# Patient Record
Sex: Female | Born: 1985 | Hispanic: Yes | Marital: Single | Smoking: Never smoker
Health system: Southern US, Community
[De-identification: ages and names within clinical notes are randomized; demographics above are authoritative.]

## PROBLEM LIST (undated history)

## (undated) DIAGNOSIS — H409 Unspecified glaucoma: Secondary | ICD-10-CM

## (undated) DIAGNOSIS — E119 Type 2 diabetes mellitus without complications: Secondary | ICD-10-CM

## (undated) DIAGNOSIS — Z789 Other specified health status: Secondary | ICD-10-CM

## (undated) HISTORY — DX: Unspecified glaucoma: H40.9

## (undated) HISTORY — PX: NO PAST SURGERIES: SHX2092

## (undated) HISTORY — DX: Type 2 diabetes mellitus without complications: E11.9

## (undated) HISTORY — DX: Other specified health status: Z78.9

---
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
## 2005-02-23 ENCOUNTER — Ambulatory Visit: Payer: Self-pay | Admitting: Internal Medicine

## 2005-04-06 ENCOUNTER — Ambulatory Visit: Payer: Self-pay | Admitting: Internal Medicine

## 2005-04-06 ENCOUNTER — Encounter (INDEPENDENT_AMBULATORY_CARE_PROVIDER_SITE_OTHER): Payer: Self-pay | Admitting: Specialist

## 2005-04-20 ENCOUNTER — Ambulatory Visit: Payer: Self-pay | Admitting: Internal Medicine

## 2005-07-13 ENCOUNTER — Ambulatory Visit: Payer: Self-pay | Admitting: *Deleted

## 2005-07-13 ENCOUNTER — Other Ambulatory Visit: Admission: RE | Admit: 2005-07-13 | Discharge: 2005-07-13 | Payer: Self-pay | Admitting: *Deleted

## 2005-07-27 ENCOUNTER — Ambulatory Visit: Payer: Self-pay | Admitting: Family Medicine

## 2006-02-01 ENCOUNTER — Encounter (INDEPENDENT_AMBULATORY_CARE_PROVIDER_SITE_OTHER): Payer: Self-pay | Admitting: *Deleted

## 2006-02-01 ENCOUNTER — Ambulatory Visit: Payer: Self-pay | Admitting: Obstetrics and Gynecology

## 2007-05-01 ENCOUNTER — Ambulatory Visit: Payer: Self-pay | Admitting: Obstetrics and Gynecology

## 2007-05-01 ENCOUNTER — Encounter: Payer: Self-pay | Admitting: Obstetrics & Gynecology

## 2007-05-09 ENCOUNTER — Ambulatory Visit (HOSPITAL_COMMUNITY): Admission: RE | Admit: 2007-05-09 | Discharge: 2007-05-09 | Payer: Self-pay | Admitting: Obstetrics & Gynecology

## 2007-05-22 ENCOUNTER — Ambulatory Visit: Payer: Self-pay | Admitting: Obstetrics & Gynecology

## 2008-04-11 ENCOUNTER — Inpatient Hospital Stay (HOSPITAL_COMMUNITY): Admission: AD | Admit: 2008-04-11 | Discharge: 2008-04-12 | Payer: Self-pay | Admitting: Obstetrics & Gynecology

## 2008-04-14 ENCOUNTER — Inpatient Hospital Stay (HOSPITAL_COMMUNITY): Admission: AD | Admit: 2008-04-14 | Discharge: 2008-04-14 | Payer: Self-pay | Admitting: Obstetrics & Gynecology

## 2008-04-17 ENCOUNTER — Inpatient Hospital Stay (HOSPITAL_COMMUNITY): Admission: RE | Admit: 2008-04-17 | Discharge: 2008-04-17 | Payer: Self-pay | Admitting: Obstetrics & Gynecology

## 2008-12-06 ENCOUNTER — Inpatient Hospital Stay (HOSPITAL_COMMUNITY): Admission: AD | Admit: 2008-12-06 | Discharge: 2008-12-08 | Payer: Self-pay | Admitting: Obstetrics

## 2009-03-28 ENCOUNTER — Emergency Department (HOSPITAL_COMMUNITY): Admission: EM | Admit: 2009-03-28 | Discharge: 2009-03-28 | Payer: Self-pay | Admitting: Emergency Medicine

## 2010-06-24 LAB — CBC
HCT: 28.3 % — ABNORMAL LOW (ref 36.0–46.0)
Hemoglobin: 9.4 g/dL — ABNORMAL LOW (ref 12.0–15.0)
MCV: 83.8 fL (ref 78.0–100.0)
Platelets: 258 10*3/uL (ref 150–400)
RDW: 15.4 % (ref 11.5–15.5)

## 2010-07-04 LAB — URINALYSIS, ROUTINE W REFLEX MICROSCOPIC
Glucose, UA: NEGATIVE mg/dL
Ketones, ur: NEGATIVE mg/dL
Nitrite: NEGATIVE
Protein, ur: NEGATIVE mg/dL
pH: 6.5 (ref 5.0–8.0)

## 2010-07-04 LAB — ABO/RH: ABO/RH(D): O POS

## 2010-07-04 LAB — WET PREP, GENITAL
Trich, Wet Prep: NONE SEEN
Yeast Wet Prep HPF POC: NONE SEEN

## 2010-07-04 LAB — CBC
MCHC: 33.1 g/dL (ref 30.0–36.0)
MCV: 87.7 fL (ref 78.0–100.0)
RBC: 4.37 MIL/uL (ref 3.87–5.11)
RDW: 14.1 % (ref 11.5–15.5)

## 2010-07-04 LAB — HCG, QUANTITATIVE, PREGNANCY
hCG, Beta Chain, Quant, S: 1235 m[IU]/mL — ABNORMAL HIGH (ref <5)
hCG, Beta Chain, Quant, S: 2686 m[IU]/mL — ABNORMAL HIGH (ref <5)

## 2010-08-02 NOTE — H&P (Signed)
NAME:  Allison Monroe, THINNES NO.:  [id number]   MEDICAL RECORD NO.:  [id number]          PATIENT TYPE:  WOC   LOCATION:  WOC                          FACILITY:  [facility]   PHYSICIAN:  Ruthe Mannan, M.D.       DATE OF BIRTH:  [birth year]   DATE OF ADMISSION:  05/22/2007  DATE OF DISCHARGE:                              HISTORY & PHYSICAL   CHIEF COMPLAINT:  The patient presents for follow-up of ultrasound.   HISTORY OF PRESENT ILLNESS:  The patient was here on May 01, 2007,  for Pap smear and was also complaining of pelvic pain at that time.  She  was found to have some suprapubic pain.  At that time she had a negative  UA, a negative urine pregnancy test.  Pap came back normal.  GC and  chlamydia was also negative.  Wet prep did show yeast and she did not  yet receive her prescription for Diflucan.  She reports today and says  that her pain is better, however, she was told to come follow up for  evaluation of her ultrasound results.   PHYSICAL EXAMINATION:  VITAL SIGNS:  Temperature 97.8, pulse 68,  respiratory rate 16, blood pressure 109/67, weight 151.3.  GENERAL:  She is alert and oriented in no acute distress.  Pleasant.  ABDOMEN:  Nontender to palpation throughout.  No rebound and no  guarding.   ASSESSMENT:  This is a 25 year old gravida 0, who presents for a follow-  up of pelvic ultrasound performed at her annual examination in February.  Pap was normal and Wet prep only showed yeast.  Prescription for  Diflucan 150 mg p.o. x1 given.  She states that she is interested in  getting HIV test.  We will go ahead and check HIV today.      Ruthe Mannan, M.D.  Electronically Signed     TA/MEDQ  D:  05/22/2007  T:  05/22/2007  Job:  [id number]

## 2010-09-23 ENCOUNTER — Emergency Department (HOSPITAL_COMMUNITY): Payer: Self-pay

## 2010-09-23 ENCOUNTER — Emergency Department (HOSPITAL_COMMUNITY)
Admission: EM | Admit: 2010-09-23 | Discharge: 2010-09-24 | Discharge status: Against Medical Advice | Payer: Self-pay | Attending: Emergency Medicine | Admitting: Emergency Medicine

## 2010-09-23 DIAGNOSIS — R05 Cough: Secondary | ICD-10-CM | POA: Insufficient documentation

## 2010-09-23 DIAGNOSIS — R059 Cough, unspecified: Secondary | ICD-10-CM | POA: Insufficient documentation

## 2010-09-24 ENCOUNTER — Emergency Department (HOSPITAL_COMMUNITY): Payer: Self-pay

## 2010-09-24 ENCOUNTER — Emergency Department (HOSPITAL_COMMUNITY)
Admission: EM | Admit: 2010-09-24 | Discharge: 2010-09-24 | Disposition: A | Discharge status: Home / Self Care | Payer: Self-pay | Attending: Emergency Medicine | Admitting: Emergency Medicine

## 2010-09-24 DIAGNOSIS — IMO0001 Reserved for inherently not codable concepts without codable children: Secondary | ICD-10-CM | POA: Insufficient documentation

## 2010-09-24 DIAGNOSIS — R059 Cough, unspecified: Secondary | ICD-10-CM | POA: Insufficient documentation

## 2010-09-24 DIAGNOSIS — R05 Cough: Secondary | ICD-10-CM | POA: Insufficient documentation

## 2010-09-24 DIAGNOSIS — J069 Acute upper respiratory infection, unspecified: Secondary | ICD-10-CM | POA: Insufficient documentation

## 2010-12-09 LAB — POCT URINALYSIS DIP (DEVICE)
Nitrite: NEGATIVE
Urobilinogen, UA: 0.2
pH: 7

## 2012-12-16 ENCOUNTER — Encounter (HOSPITAL_COMMUNITY): Payer: Self-pay | Admitting: Emergency Medicine

## 2012-12-16 ENCOUNTER — Emergency Department (HOSPITAL_COMMUNITY)
Admission: EM | Admit: 2012-12-16 | Discharge: 2012-12-16 | Disposition: A | Discharge status: Home / Self Care | Payer: Self-pay | Source: Home / Self Care | Attending: Emergency Medicine | Admitting: Emergency Medicine

## 2012-12-16 DIAGNOSIS — H6091 Unspecified otitis externa, right ear: Secondary | ICD-10-CM

## 2012-12-16 DIAGNOSIS — H6691 Otitis media, unspecified, right ear: Secondary | ICD-10-CM

## 2012-12-16 DIAGNOSIS — T161XXA Foreign body in right ear, initial encounter: Secondary | ICD-10-CM

## 2012-12-16 MED ORDER — NEOMYCIN-POLYMYXIN-HC 3.5-10000-1 OT SUSP: 4.0000 [drp] | Freq: Three times a day (TID) | OTIC | Status: DC

## 2012-12-16 MED ORDER — AMOXICILLIN 500 MG PO CAPS: 1000.0000 mg | ORAL_CAPSULE | Freq: Three times a day (TID) | ORAL | Status: DC

## 2012-12-16 NOTE — ED Provider Notes (Signed)
Chief Complaint:   Chief Complaint  Patient presents with  . Otalgia    History of Present Illness:   Allison Monroe is a 27 year old female who has had a three-week history of pain in her right ear, drainage, and a sensation of congestion. She denies any fever, headache, nasal congestion, rhinorrhea, sore throat, adenopathy, or cough.  Review of Systems:  Other than noted above, the patient denies any of the following symptoms: Systemic:  No fevers, chills, sweats, weight loss or gain, fatigue, or tiredness. Eye:  No redness, pain, discharge, itching, blurred vision, or diplopia. ENT:  No headache, nasal congestion, sneezing, itching, epistaxis, ear pain, congestion, decreased hearing, ringing in ears, vertigo, or tinnitus.  No oral lesions, sore throat, pain on swallowing, or hoarseness. Neck:  No mass, tenderness or adenopathy. Lungs:  No coughing, wheezing, or shortness of breath. Skin:  No rash or itching.  PMFSH:  Past medical history, family history, social history, meds, and allergies were reviewed.   Physical Exam:   Vital signs:  BP 115/58  Pulse 68  Temp(Src) 98.2 F (36.8 C) (Oral)  Resp 16  SpO2 100% General:  Alert and oriented.  In no distress.  Skin warm and dry. Eye:  PERRL, full EOMs, lids and conjunctiva normal.   ENT:  There was what appears to be a foreign body in the right ear canal, the TM was erythematous, the canal was also erythematous. Left TM and canal were normal. Nasal mucosa not congested and without drainage.  Mucous membranes moist, no oral lesions, normal dentition, pharynx clear.  No cranial or facial pain to palplation. Neck:  Supple, full ROM.  No adenopathy, tenderness or mass.  Thyroid normal. Lungs:  Breath sounds clear and equal bilaterally.  No wheezes, rales or rhonchi. Heart:  Rhythm regular, without extrasystoles.  No gallops or murmers. Skin:  Clear, warm and dry.   Course in Urgent Care Center:   The foreign body was irrigated out  and appears to be the cotton tip of a Q-tip.  Assessment:  The primary encounter diagnosis was Acute foreign body of ear canal, right, initial encounter. Diagnoses of Otitis externa, right and Otitis media, right were also pertinent to this visit.  Plan:   1.  Meds:  The following meds were prescribed:   New Prescriptions   AMOXICILLIN (AMOXIL) 500 MG CAPSULE    Take 2 capsules (1,000 mg total) by mouth 3 (three) times daily.   NEOMYCIN-POLYMYXIN-HYDROCORTISONE (CORTISPORIN) 3.5-10000-1 OTIC SUSPENSION    Place 4 drops into the right ear 3 (three) times daily.    2.  Patient Education/Counseling:  The patient was given appropriate handouts, self care instructions, and instructed in symptomatic relief.  Advised to avoid Q-tips in the future and to keep water out of the ear for the next week.  3.  Follow up:  The patient was told to follow up if no better in 3 to 4 days, if becoming worse in any way, and given some red flag symptoms such as worsening pain or difficulty hearing which would prompt immediate return.  Follow up here if necessary.     Reuben Likes, MD 12/16/12 [phone]

## 2012-12-16 NOTE — ED Notes (Signed)
C/o right ear pain.  Patient states she has used OTC debrox but no relief.

## 2013-11-28 ENCOUNTER — Other Ambulatory Visit: Payer: Self-pay | Admitting: Physician Assistant

## 2013-11-28 DIAGNOSIS — R234 Changes in skin texture: Secondary | ICD-10-CM

## 2013-11-28 DIAGNOSIS — N649 Disorder of breast, unspecified: Secondary | ICD-10-CM

## 2013-12-03 ENCOUNTER — Ambulatory Visit
Admission: RE | Admit: 2013-12-03 | Discharge: 2013-12-03 | Disposition: A | Discharge status: Home / Self Care | Payer: No Typology Code available for payment source | Source: Ambulatory Visit | Attending: Physician Assistant | Admitting: Physician Assistant

## 2013-12-03 ENCOUNTER — Encounter (INDEPENDENT_AMBULATORY_CARE_PROVIDER_SITE_OTHER): Payer: Self-pay

## 2013-12-03 DIAGNOSIS — N649 Disorder of breast, unspecified: Secondary | ICD-10-CM

## 2013-12-03 DIAGNOSIS — R234 Changes in skin texture: Secondary | ICD-10-CM

## 2015-11-07 ENCOUNTER — Encounter (HOSPITAL_COMMUNITY): Payer: Self-pay

## 2015-11-07 ENCOUNTER — Emergency Department (HOSPITAL_COMMUNITY)
Admission: EM | Admit: 2015-11-07 | Discharge: 2015-11-07 | Disposition: A | Discharge status: Home / Self Care | Payer: Self-pay | Attending: Emergency Medicine | Admitting: Emergency Medicine

## 2015-11-07 DIAGNOSIS — M5431 Sciatica, right side: Secondary | ICD-10-CM | POA: Insufficient documentation

## 2015-11-07 DIAGNOSIS — B3741 Candidal cystitis and urethritis: Secondary | ICD-10-CM

## 2015-11-07 DIAGNOSIS — N3090 Cystitis, unspecified without hematuria: Secondary | ICD-10-CM | POA: Insufficient documentation

## 2015-11-07 LAB — URINALYSIS, ROUTINE W REFLEX MICROSCOPIC
BILIRUBIN URINE: NEGATIVE
GLUCOSE, UA: NEGATIVE mg/dL
HGB URINE DIPSTICK: NEGATIVE
KETONES UR: NEGATIVE mg/dL
Nitrite: NEGATIVE
PROTEIN: NEGATIVE mg/dL
Specific Gravity, Urine: 1.015 (ref 1.005–1.030)
pH: 6 (ref 5.0–8.0)

## 2015-11-07 LAB — URINE MICROSCOPIC-ADD ON

## 2015-11-07 MED ORDER — PREDNISONE 20 MG PO TABS: 40.0000 mg | ORAL_TABLET | Freq: Every day | ORAL | 0 refills | Status: DC

## 2015-11-07 MED ORDER — TRAMADOL HCL 50 MG PO TABS: 50.0000 mg | ORAL_TABLET | Freq: Four times a day (QID) | ORAL | 0 refills | Status: DC | PRN

## 2015-11-07 MED ORDER — FLUCONAZOLE 100 MG PO TABS
150.0000 mg | ORAL_TABLET | Freq: Once | ORAL | Status: AC
  Administered 2015-11-07: 150 mg via ORAL
  Filled 2015-11-07: qty 2

## 2015-11-07 MED ORDER — KETOROLAC TROMETHAMINE 60 MG/2ML IM SOLN
60.0000 mg | Freq: Once | INTRAMUSCULAR | Status: AC
  Administered 2015-11-07: 60 mg via INTRAMUSCULAR
  Filled 2015-11-07: qty 2

## 2015-11-07 MED ORDER — CYCLOBENZAPRINE HCL 10 MG PO TABS
10.0000 mg | ORAL_TABLET | Freq: Once | ORAL | Status: AC
  Administered 2015-11-07: 10 mg via ORAL
  Filled 2015-11-07: qty 1

## 2015-11-07 MED ORDER — DEXAMETHASONE SODIUM PHOSPHATE 10 MG/ML IJ SOLN
10.0000 mg | Freq: Once | INTRAMUSCULAR | Status: AC
  Administered 2015-11-07: 10 mg via INTRAMUSCULAR
  Filled 2015-11-07: qty 1

## 2015-11-07 MED ORDER — TRAMADOL HCL 50 MG PO TABS
50.0000 mg | ORAL_TABLET | Freq: Once | ORAL | Status: AC
  Administered 2015-11-07: 50 mg via ORAL
  Filled 2015-11-07: qty 1

## 2015-11-07 MED ORDER — CYCLOBENZAPRINE HCL 10 MG PO TABS
10.0000 mg | ORAL_TABLET | Freq: Two times a day (BID) | ORAL | 0 refills | Status: DC | PRN
Start: 2015-11-07 — End: 2016-03-08

## 2015-11-07 NOTE — ED Triage Notes (Signed)
Patient here with ongoing right lower back pain x 1 month. Seen on 8/8 with negative urine and negative pelvic, no relief with mobic

## 2015-11-07 NOTE — ED Provider Notes (Signed)
[facility] DEPT Provider Note   CSN: [id number] Arrival date & time: 11/07/15  [id number]  By signing my name below, I, Royce Macadamia, attest that this documentation has been prepared under the direction and in the presence of  Delsa Grana, PA-C. Electronically Signed: Royce Macadamia, ED Scribe. 11/07/15. 11:23 AM.   History   Chief Complaint Chief Complaint  Patient presents with  . Back Pain   The history is provided by the patient. No language interpreter was used.    HPI Comments:  Allison Monroe is a 30 y.o. female who presents to the Emergency Department complaining of persistent lower back pain beginning a month ago.  She describes the pain as 6-7/10 and states that it radiates to her right buttock, right flank and right upper abdomen.  Her pain is worse with movement and ablulation and she notes a pressure when sitting.  She states that she woke up one day with mild pain and has gradually worsened over the past month. She was evaluated 12 days ago for the same complaint at [facility] and she was given Mobic but she states she has had no relief.  Urine testing was negative at that time.  However she does complain of recent urinary frequency with small volume of urine but denies hematuria, suprapubic pain, abdominal pain, nausea, vomiting, fever.  She denies any vaginal complaints and her last measure. She cannot remember because she has been getting depo injections.   History reviewed. No pertinent past medical history.  There are no active problems to display for this patient.   History reviewed. No pertinent surgical history.  OB History    No data available       Home Medications    Prior to Admission medications   Medication Sig Start Date End Date Taking? Authorizing Provider  amoxicillin (AMOXIL) 500 MG capsule Take 2 capsules (1,000 mg total) by mouth 3 (three) times daily. 12/16/12   Harden Mo, MD  cyclobenzaprine (FLEXERIL) 10 MG tablet Take 1 tablet  (10 mg total) by mouth 2 (two) times daily as needed for muscle spasms. 11/07/15   Delsa Grana, PA-C  neomycin-polymyxin-hydrocortisone (CORTISPORIN) 3.5-10000-1 otic suspension Place 4 drops into the right ear 3 (three) times daily. 12/16/12   Harden Mo, MD  predniSONE (DELTASONE) 20 MG tablet Take 2 tablets (40 mg total) by mouth daily. 11/07/15   Delsa Grana, PA-C  traMADol (ULTRAM) 50 MG tablet Take 1 tablet (50 mg total) by mouth every 6 (six) hours as needed. 11/07/15   Delsa Grana, PA-C    Family History No family history on file.  Social History Social History  Substance Use Topics  . Smoking status: Never Smoker  . Smokeless tobacco: Never Used  . Alcohol use Not on file     Allergies   Review of patient's allergies indicates no known allergies.   Review of Systems Review of Systems  All other systems reviewed and are negative.    Physical Exam Updated Vital Signs BP 119/87 (BP Location: Left Arm)   Pulse 60   Temp 97.7 F (36.5 C) (Oral)   Resp 18   SpO2 100%   Physical Exam  Constitutional: She is oriented to person, place, and time. She appears well-developed and well-nourished. No distress.  HENT:  Head: Normocephalic and atraumatic.  Nose: Nose normal.  Mouth/Throat: Oropharynx is clear and moist. No oropharyngeal exudate.  Eyes: Conjunctivae and EOM are normal. Pupils are equal, round, and reactive to light. Right eye exhibits no  discharge. Left eye exhibits no discharge. No scleral icterus.  Neck: Normal range of motion. No JVD present. No tracheal deviation present. No thyromegaly present.  Cardiovascular: Normal rate, regular rhythm, normal heart sounds and intact distal pulses.  Exam reveals no gallop and no friction rub.   No murmur heard. Pulmonary/Chest: Effort normal and breath sounds normal. No respiratory distress. She has no wheezes. She has no rales. She exhibits no tenderness.  Abdominal: Soft. Bowel sounds are normal. She exhibits no  distension and no mass. There is no tenderness. There is no rebound and no guarding.  No CVA tenderness  Musculoskeletal: Normal range of motion. She exhibits tenderness. She exhibits no edema or deformity.  Lower lumbar tendernes to palpation.  No spinal step offs.  Right lumbar para-spinal tendermess and tenderness over her SI joint and into her right buttocks.     Lymphadenopathy:    She has no cervical adenopathy.  Neurological: She is alert and oriented to person, place, and time. She exhibits normal muscle tone. Coordination normal.  Normal gait  Skin: Skin is warm and dry. Capillary refill takes less than 2 seconds. No rash noted. She is not diaphoretic. No erythema. No pallor.  Psychiatric: She has a normal mood and affect. Her behavior is normal. Judgment and thought content normal.  Nursing note and vitals reviewed.    ED Treatments / Results   DIAGNOSTIC STUDIES:  Oxygen Saturation is 99% on RA, nml by my interpretation.    COORDINATION OF CARE:  11:23 AM Discussed treatment plan with pt at bedside and pt agreed to plan.  Labs (all labs ordered are listed, but only abnormal results are displayed) Labs Reviewed  URINALYSIS, ROUTINE W REFLEX MICROSCOPIC (NOT AT [facility]) - Abnormal; Notable for the following:       Result Value   Leukocytes, UA TRACE (*)    All other components within normal limits  URINE MICROSCOPIC-ADD ON - Abnormal; Notable for the following:    Squamous Epithelial / LPF 0-5 (*)    Bacteria, UA RARE (*)    All other components within normal limits    EKG  EKG Interpretation None       Radiology No results found.  Procedures Procedures (including critical care time)  Medications Ordered in ED Medications  ketorolac (TORADOL) injection 60 mg (60 mg Intramuscular Given 11/07/15 1215)  dexamethasone (DECADRON) injection 10 mg (10 mg Intramuscular Given 11/07/15 1215)  cyclobenzaprine (FLEXERIL) tablet 10 mg (10 mg Oral Given 11/07/15 1216)    traMADol (ULTRAM) tablet 50 mg (50 mg Oral Given 11/07/15 1216)  fluconazole (DIFLUCAN) tablet 150 mg (150 mg Oral Given 11/07/15 1216)     Initial Impression / Assessment and Plan / ED Course  I have reviewed the triage vital signs and the nursing notes.  Pertinent labs & imaging results that were available during my care of the patient were reviewed by me and considered in my medical decision making (see chart for details).  Clinical Course    Patient with 1 month of lower right back pain, clinically consistent with sciatica. Worked up for urinary complaints including frequency, no abdominal complaints, no constitutional symptoms. Patient is well-appearing with stable vital signs, afebrile.   No neurological deficits and normal neuro exam.     No loss of bowel or bladder control.  No concern for cauda equina.  No fever, night sweats, weight loss, h/o cancer, IVDU.  Urinalysis was negative for UTI but pertinent for presence of yeast, treated with Diflucan.  Back pain treated with muscle relaxers, steroids, pain medicine.  She was given Toradol and Decadron in the ER. Discharged in good condition.  Final Clinical Impressions(s) / ED Diagnoses   Final diagnoses:  Sciatica of right side  Yeast cystitis    New Prescriptions Discharge Medication List as of 11/07/2015 12:09 PM    START taking these medications   Details  cyclobenzaprine (FLEXERIL) 10 MG tablet Take 1 tablet (10 mg total) by mouth 2 (two) times daily as needed for muscle spasms., Starting Sun 11/07/2015, Print    predniSONE (DELTASONE) 20 MG tablet Take 2 tablets (40 mg total) by mouth daily., Starting Sun 11/07/2015, Print    traMADol (ULTRAM) 50 MG tablet Take 1 tablet (50 mg total) by mouth every 6 (six) hours as needed., Starting Sun 11/07/2015, Print       I personally performed the services described in this documentation, which was scribed in my presence. The recorded information has been reviewed and is accurate.        Delsa Grana, PA-C 11/07/15 1354    Gareth Morgan, MD 11/11/15 [phone]

## 2015-11-07 NOTE — ED Notes (Signed)
Declined W/C at D/C and was escorted to lobby by RN. 

## 2016-03-08 ENCOUNTER — Ambulatory Visit (HOSPITAL_COMMUNITY)
Admission: EM | Admit: 2016-03-08 | Discharge: 2016-03-08 | Disposition: A | Discharge status: Home / Self Care | Payer: Self-pay | Attending: Emergency Medicine | Admitting: Emergency Medicine

## 2016-03-08 ENCOUNTER — Encounter (HOSPITAL_COMMUNITY): Payer: Self-pay | Admitting: Emergency Medicine

## 2016-03-08 DIAGNOSIS — K121 Other forms of stomatitis: Secondary | ICD-10-CM

## 2016-03-08 MED ORDER — AMOXICILLIN 500 MG PO CAPS: 500.0000 mg | ORAL_CAPSULE | Freq: Three times a day (TID) | ORAL | 0 refills | Status: DC

## 2016-03-08 NOTE — ED Triage Notes (Addendum)
Pt reports a sore in her left upper mouth that has been there for about 10 days.  Pt just started using Paroxyl last night.  Pt denies any fever.

## 2016-03-08 NOTE — ED Provider Notes (Signed)
[place]    CSN: [id number] Arrival date & time: 03/08/16  1536     History   Chief Complaint Chief Complaint  Patient presents with  . Mouth Lesions    HPI Allison Monroe is a 30 y.o. female.   HPI She is a 30 year old woman here for evaluation of mouth lesion. She states it has been present about 10 days. Initially, it was a small bump, but has gradually been getting bigger in size. It is quite painful. She has not noticed any drainage. No fevers. She does have a little bit of discomfort with swallowing. She just started a hydrogen peroxide rinse last night.  History reviewed. No pertinent past medical history.  There are no active problems to display for this patient.   History reviewed. No pertinent surgical history.  OB History    No data available       Home Medications    Prior to Admission medications   Medication Sig Start Date End Date Taking? Authorizing Provider  amoxicillin (AMOXIL) 500 MG capsule Take 1 capsule (500 mg total) by mouth 3 (three) times daily. 03/08/16   Melony Overly, MD    Family History History reviewed. No pertinent family history.  Social History Social History  Substance Use Topics  . Smoking status: Never Smoker  . Smokeless tobacco: Never Used  . Alcohol use Not on file     Allergies   Patient has no known allergies.   Review of Systems Review of Systems As in history of present illness  Physical Exam Triage Vital Signs ED Triage Vitals [03/08/16 1553]  Enc Vitals Group     BP (!) 114/52     Pulse Rate 67     Resp      Temp 98.3 F (36.8 C)     Temp Source Oral     SpO2 100 %     Weight      Height      Head Circumference      Peak Flow      Pain Score 7     Pain Loc      Pain Edu?      Excl. in [place]?    No data found.   Updated Vital Signs BP (!) 114/52 (BP Location: Left Arm)   Pulse 67   Temp 98.3 F (36.8 C) (Oral)   SpO2 100%   Visual Acuity Right Eye Distance:     Left Eye Distance:   Bilateral Distance:    Right Eye Near:   Left Eye Near:    Bilateral Near:     Physical Exam  Constitutional: She is oriented to person, place, and time. She appears well-developed and well-nourished. No distress.  HENT:  Mouth/Throat:    Cardiovascular: Normal rate.   Pulmonary/Chest: Effort normal.  Neurological: She is alert and oriented to person, place, and time.     UC Treatments / Results  Labs (all labs ordered are listed, but only abnormal results are displayed) Labs Reviewed - No data to display  EKG  EKG Interpretation None       Radiology No results found.  Procedures Procedures (including critical care time)  Medications Ordered in UC Medications - No data to display   Initial Impression / Assessment and Plan / UC Course  I have reviewed the triage vital signs and the nursing notes.  Pertinent labs & imaging results that were available during my care of the patient were reviewed by me and  considered in my medical decision making (see chart for details).  Clinical Course     We'll cover with antibiotics with amoxicillin. OTC Xilactin to help control pain. Follow-up if not improving in 1 week.  Final Clinical Impressions(s) / UC Diagnoses   Final diagnoses:  Mouth ulcer    New Prescriptions New Prescriptions   AMOXICILLIN (AMOXIL) 500 MG CAPSULE    Take 1 capsule (500 mg total) by mouth 3 (three) times daily.     Melony Overly, MD 03/08/16 [phone]

## 2016-03-08 NOTE — Discharge Instructions (Signed)
You have a small ulcer in the mouth. Take amoxicillin 3 times a day for 1 week. Use Xilactin as directed. If this has not healed in 1 week, please come back.

## 2018-04-10 ENCOUNTER — Other Ambulatory Visit: Payer: Self-pay

## 2018-04-10 ENCOUNTER — Encounter (HOSPITAL_COMMUNITY): Payer: Self-pay | Admitting: Emergency Medicine

## 2018-04-10 ENCOUNTER — Ambulatory Visit (HOSPITAL_COMMUNITY)
Admission: EM | Admit: 2018-04-10 | Discharge: 2018-04-10 | Disposition: A | Discharge status: Home / Self Care | Payer: Self-pay | Attending: Family Medicine | Admitting: Family Medicine

## 2018-04-10 DIAGNOSIS — K2100 Gastro-esophageal reflux disease with esophagitis, without bleeding: Secondary | ICD-10-CM

## 2018-04-10 MED ORDER — OMEPRAZOLE 20 MG PO CPDR: 20.0000 mg | DELAYED_RELEASE_CAPSULE | Freq: Every day | ORAL | 1 refills | Status: DC

## 2018-04-10 NOTE — ED Triage Notes (Signed)
Pt presents to [facility] for assessment of chest pain since 08-28-17.  States her mother died and she would have intermittent episodes of central chest pain.  In the past two weeks the pain has become more constant and stronger, no radiating to both shoulders.  Denies any other associated symptoms.  States she has had a lot of depression, anxiety and fatigue since 08-29-2022.

## 2018-04-10 NOTE — ED Provider Notes (Signed)
[place]    CSN: [id number] Arrival date & time: 04/10/18  1556     History   Chief Complaint Chief Complaint  Patient presents with  . Chest Pain    HPI Allison Monroe is a 33 y.o. female.   Patient has some nonspecific chest pain.  Also admits to reflux.  She has no risk factors for heart disease.  Pain is not related to exertion.  HPI  History reviewed. No pertinent past medical history.  There are no active problems to display for this patient.   History reviewed. No pertinent surgical history.  OB History   No obstetric history on file.      Home Medications    Prior to Admission medications   Medication Sig Start Date End Date Taking? Authorizing Provider  amoxicillin (AMOXIL) 500 MG capsule Take 1 capsule (500 mg total) by mouth 3 (three) times daily. 03/08/16   Melony Overly, MD  omeprazole (PRILOSEC) 20 MG capsule Take 1 capsule (20 mg total) by mouth daily. 04/10/18   Wardell Honour, MD    Family History History reviewed. No pertinent family history.  Social History Social History   Tobacco Use  . Smoking status: Never Smoker  . Smokeless tobacco: Never Used  Substance Use Topics  . Alcohol use: Not on file  . Drug use: Not on file     Allergies   Patient has no known allergies.   Review of Systems Review of Systems  Constitutional: Negative.   Cardiovascular: Positive for chest pain.  All other systems reviewed and are negative.    Physical Exam Triage Vital Signs ED Triage Vitals  Enc Vitals Group     BP 04/10/18 1642 (!) 143/89     Pulse Rate 04/10/18 1642 60     Resp 04/10/18 1642 18     Temp 04/10/18 1642 98.6 F (37 C)     Temp Source 04/10/18 1642 Oral     SpO2 04/10/18 1642 100 %     Weight --      Height --      Head Circumference --      Peak Flow --      Pain Score 04/10/18 1643 7     Pain Loc --      Pain Edu? --      Excl. in [place]? --    No data found.  Updated Vital Signs BP (!)  143/89 (BP Location: Left Arm)   Pulse 60   Temp 98.6 F (37 C) (Oral)   Resp 18   SpO2 100%   Visual Acuity Right Eye Distance:   Left Eye Distance:   Bilateral Distance:    Right Eye Near:   Left Eye Near:    Bilateral Near:     Physical Exam Constitutional:      Appearance: She is well-developed.  Cardiovascular:     Heart sounds: Normal heart sounds. No murmur. No systolic murmur. No diastolic murmur.  Pulmonary:     Effort: Pulmonary effort is normal.     Breath sounds: Normal breath sounds.  Neurological:     General: No focal deficit present.     Mental Status: She is alert and oriented to person, place, and time.    EKG has some nonspecific findings but no acute ischemic changes  UC Treatments / Results  Labs (all labs ordered are listed, but only abnormal results are displayed) Labs Reviewed - No data to display  EKG None  Radiology No results found.  Procedures Procedures (including critical care time)  Medications Ordered in UC Medications - No data to display  Initial Impression / Assessment and Plan / UC Course  I have reviewed the triage vital signs and the nursing notes.  Pertinent labs & imaging results that were available during my care of the patient were reviewed by me and considered in my medical decision making (see chart for details).     Chest pain, probably related to esophageal reflux and spasm Final Clinical Impressions(s) / UC Diagnoses   Final diagnoses:  Gastroesophageal reflux disease with esophagitis   Discharge Instructions   None    ED Prescriptions    Medication Sig Dispense Auth. Provider   omeprazole (PRILOSEC) 20 MG capsule Take 1 capsule (20 mg total) by mouth daily. 15 capsule Wardell Honour, MD     Controlled Substance Prescriptions [place] Controlled Substance Registry consulted? No   Wardell Honour, MD 04/10/18 1740

## 2018-04-22 ENCOUNTER — Encounter (HOSPITAL_COMMUNITY): Payer: Self-pay | Admitting: *Deleted

## 2018-04-22 ENCOUNTER — Emergency Department (HOSPITAL_COMMUNITY): Payer: Self-pay

## 2018-04-22 ENCOUNTER — Emergency Department (HOSPITAL_COMMUNITY)
Admission: EM | Admit: 2018-04-22 | Discharge: 2018-04-22 | Disposition: A | Discharge status: Home / Self Care | Payer: Self-pay | Attending: Emergency Medicine | Admitting: Emergency Medicine

## 2018-04-22 DIAGNOSIS — R0789 Other chest pain: Secondary | ICD-10-CM | POA: Insufficient documentation

## 2018-04-22 DIAGNOSIS — R079 Chest pain, unspecified: Secondary | ICD-10-CM

## 2018-04-22 LAB — CBC
HCT: 42.6 % (ref 36.0–46.0)
HEMOGLOBIN: 13.9 g/dL (ref 12.0–15.0)
MCH: 28.8 pg (ref 26.0–34.0)
MCHC: 32.6 g/dL (ref 30.0–36.0)
MCV: 88.2 fL (ref 80.0–100.0)
Platelets: 331 10*3/uL (ref 150–400)
RBC: 4.83 MIL/uL (ref 3.87–5.11)
RDW: 13.5 % (ref 11.5–15.5)
WBC: 11.2 10*3/uL — AB (ref 4.0–10.5)
nRBC: 0 % (ref 0.0–0.2)

## 2018-04-22 LAB — I-STAT TROPONIN, ED: Troponin i, poc: 0 ng/mL (ref 0.00–0.08)

## 2018-04-22 LAB — I-STAT CREATININE, ED: CREATININE: 0.8 mg/dL (ref 0.44–1.00)

## 2018-04-22 MED ORDER — SODIUM CHLORIDE 0.9% FLUSH: 3.0000 mL | Freq: Once | INTRAVENOUS | Status: DC

## 2018-04-22 NOTE — ED Triage Notes (Signed)
Pt in c/o continued chest pain, has been going on for several weeks, went to urgent care for same, pain worse with inspiration, also reports cough but its mild

## 2018-04-22 NOTE — Discharge Instructions (Addendum)
Please read attached information. If you experience any new or worsening signs or symptoms please return to the emergency room for evaluation. Please follow-up with your primary care provider or specialist as discussed.  °

## 2018-04-22 NOTE — ED Notes (Signed)
Patient verbalizes understanding of discharge instructions. Opportunity for questioning and answers were provided. Armband removed by staff, pt discharged from ED ambulatory.   

## 2018-04-22 NOTE — ED Provider Notes (Signed)
[facility]PART[facility] Provider Note   CSN: [id number] Arrival date & time: 04/22/18  1151   History   Chief Complaint Chief Complaint  Patient presents with  . Chest Pain    HPI Allison Monroe is a 33 y.o. female.  HPI   33 year old female presents today with complaints of chest pain.  Patient notes a 3-week history of persistent chest pain.  She notes this is a "poking" sensation on the left side of her chest, but now has radiated to the right.  She notes that when she takes a deep breath she feels pressure on her chest.  She denies any significant shortness of breath.  She notes a burning sensation as well.  She denies any abdominal pain, fever, or productive cough.  Patient denies history DVT or PE, denies any lower extremity swelling or edema, she notes she is Depakote for birth control, she is not pregnant, non-smoker, no personal cardiac history.  She reports that her mother had high blood pressure and her grandma had a pacemaker but no other known cardiac disease.  She was seen at urgent care where she had an EKG, she was told it was likely acid reflux was charged home on medication for reflux which did not improve her symptoms.  Patient notes she has been under significant stress as she recently lost her mother.   History reviewed. No pertinent past medical history.  There are no active problems to display for this patient.   History reviewed. No pertinent surgical history.   OB History   No obstetric history on file.      Home Medications    Prior to Admission medications   Medication Sig Start Date End Date Taking? Authorizing Provider  fluorometholone (FML) 0.1 % ophthalmic suspension Place 1 drop into both eyes daily.   Yes [provider]  ibuprofen (ADVIL,MOTRIN) 200 MG tablet Take 200-400 mg by mouth every 6 (six) hours as needed (for pain or headaches).    Yes [provider]  medroxyPROGESTERone  (DEPO-PROVERA) 150 MG/ML injection Inject 150 mg into the muscle every 3 (three) months.    Yes [provider]  timolol (BETIMOL) 0.5 % ophthalmic solution Place 1 drop into both eyes 2 (two) times daily.   Yes [provider]  amoxicillin (AMOXIL) 500 MG capsule Take 1 capsule (500 mg total) by mouth 3 (three) times daily. Patient not taking: Reported on 04/22/2018 03/08/16   Melony Overly, MD  omeprazole (PRILOSEC) 20 MG capsule Take 1 capsule (20 mg total) by mouth daily. Patient not taking: Reported on 04/22/2018 04/10/18   Wardell Honour, MD    Family History History reviewed. No pertinent family history.  Social History Social History   Tobacco Use  . Smoking status: Never Smoker  . Smokeless tobacco: Never Used  Substance Use Topics  . Alcohol use: Not on file  . Drug use: Not on file     Allergies   Patient has no known allergies.   Review of Systems Review of Systems  All other systems reviewed and are negative.    Physical Exam Updated Vital Signs BP 114/70 (BP Location: Left Arm)   Pulse 68   Temp 98.4 F (36.9 C) (Oral)   Resp 14   SpO2 99%   Physical Exam Vitals signs and nursing note reviewed.  Constitutional:      Appearance: She is well-developed.  HENT:     Head: Normocephalic and atraumatic.  Eyes:  General: No scleral icterus.       Right eye: No discharge.        Left eye: No discharge.     Conjunctiva/sclera: Conjunctivae normal.     Pupils: Pupils are equal, round, and reactive to light.  Neck:     Musculoskeletal: Normal range of motion.     Vascular: No JVD.     Trachea: No tracheal deviation.  Cardiovascular:     Rate and Rhythm: Normal rate and regular rhythm.  Pulmonary:     Effort: Pulmonary effort is normal. No respiratory distress.     Breath sounds: No stridor. No wheezing, rhonchi or rales.     Comments: No chest TTP , no rash  Chest:     Chest wall: No tenderness.  Musculoskeletal:        General:  No swelling.     Right lower leg: No edema.     Left lower leg: No edema.  Neurological:     Mental Status: She is alert and oriented to person, place, and time.     Coordination: Coordination normal.  Psychiatric:        Behavior: Behavior normal.        Thought Content: Thought content normal.        Judgment: Judgment normal.     ED Treatments / Results  Labs (all labs ordered are listed, but only abnormal results are displayed) Labs Reviewed  CBC - Abnormal; Notable for the following components:      Result Value   WBC 11.2 (*)    All other components within normal limits  I-STAT TROPONIN, ED  I-STAT CREATININE, ED    EKG EKG Interpretation  Date/Time:  Monday April 22 2018 12:00:19 EST Ventricular Rate:  82 PR Interval:  146 QRS Duration: 144 QT Interval:  410 QTC Calculation: 479 R Axis:   70 Text Interpretation:  Normal sinus rhythm Right bundle branch block Abnormal ECG No significant change since last tracing Confirmed by Pattricia Boss [phone]) on 04/22/2018 3:04:30 PM   Radiology Dg Chest 2 View  Result Date: 04/22/2018 CLINICAL DATA:  Central chest pain for 2-3 weeks, been treated for 10 days with medication for acid with no relief, intermittent shortness of breath, slight congestion, nonproductive cough EXAM: CHEST - 2 VIEW COMPARISON:  09/23/2010 FINDINGS: Normal heart size, mediastinal contours, and pulmonary vascularity. Lungs clear. No pleural effusion or pneumothorax. Bones unremarkable. IMPRESSION: Normal exam. Electronically Signed   By: Lavonia Dana M.D.   On: 04/22/2018 12:26    Procedures Procedures (including critical care time)  Medications Ordered in ED Medications  sodium chloride flush (NS) 0.9 % injection 3 mL (has no administration in time range)     Initial Impression / Assessment and Plan / ED Course  I have reviewed the triage vital signs and the nursing notes.  Pertinent labs & imaging results that were available during my care of  the patient were reviewed by me and considered in my medical decision making (see chart for details).      Assessment/Plan: 33 year old female presents today with complaints of chest pain.  Uncertain etiology, question anxiety related chest pain.  I have very low suspicion for ACS given this low heart score patient, her findings are not typical of ACS, PE or dissection.  Patient has had 3 weeks of symptoms reassuring work-up here, she referred to cardiology, she is given strict return precautions.  She verbalized understanding and agreement to today's plan had no further questions  or concerns.   Final Clinical Impressions(s) / ED Diagnoses   Final diagnoses:  Chest pain, unspecified type    ED Discharge Orders    None       Okey Regal, PA-C 04/22/18 1559    Pattricia Boss, MD 04/26/18 1550

## 2019-03-19 ENCOUNTER — Other Ambulatory Visit: Payer: Self-pay

## 2019-03-19 DIAGNOSIS — Z20822 Contact with and (suspected) exposure to covid-19: Secondary | ICD-10-CM

## 2019-03-20 LAB — NOVEL CORONAVIRUS, NAA: SARS-CoV-2, NAA: DETECTED — AB

## 2019-07-12 ENCOUNTER — Ambulatory Visit: Payer: Self-pay | Attending: Internal Medicine

## 2019-07-12 DIAGNOSIS — Z23 Encounter for immunization: Secondary | ICD-10-CM

## 2019-07-12 NOTE — Progress Notes (Signed)
   Covid-19 Vaccination Clinic  Name:  Tyresha Badenhop    MRN: [id number] DOB: 01/29/1986  07/12/2019  Ms. Perulero-Tornez was observed post Covid-19 immunization for 15 minutes without incident. She was provided with Vaccine Information Sheet and instruction to access the V-Safe system.   Ms. Armond Hang was instructed to call 911 with any severe reactions post vaccine: Marland Kitchen Difficulty breathing  . Swelling of face and throat  . A fast heartbeat  . A bad rash all over body  . Dizziness and weakness   Immunizations Administered    Name Date Dose VIS Date Route   Pfizer COVID-19 Vaccine 07/12/2019  3:27 PM 0.3 mL 05/14/2018 Intramuscular   Manufacturer: [employer], [employer]   Lot: [id number]   [place]: [id number]

## 2020-01-02 ENCOUNTER — Other Ambulatory Visit: Payer: Self-pay

## 2020-01-02 ENCOUNTER — Ambulatory Visit (HOSPITAL_COMMUNITY)
Admission: EM | Admit: 2020-01-02 | Discharge: 2020-01-02 | Disposition: A | Discharge status: Home / Self Care | Payer: Self-pay | Attending: Emergency Medicine | Admitting: Emergency Medicine

## 2020-01-02 ENCOUNTER — Encounter (HOSPITAL_COMMUNITY): Payer: Self-pay

## 2020-01-02 DIAGNOSIS — S161XXA Strain of muscle, fascia and tendon at neck level, initial encounter: Secondary | ICD-10-CM

## 2020-01-02 MED ORDER — TIZANIDINE HCL 4 MG PO TABS: 4.0000 mg | ORAL_TABLET | Freq: Four times a day (QID) | ORAL | 0 refills | Status: DC | PRN

## 2020-01-02 MED ORDER — NAPROXEN 500 MG PO TABS: 500.0000 mg | ORAL_TABLET | Freq: Two times a day (BID) | ORAL | 0 refills | Status: DC

## 2020-01-02 NOTE — ED Provider Notes (Addendum)
[facility]    CSN: [id number] Arrival date & time: 01/02/20  1509      History   Chief Complaint Chief Complaint  Patient presents with  . Neck Pain    since saturday    HPI Allison Monroe is a 34 y.o. female presenting today for evaluation of neck pain. Reports neck pain for 5 days, reports pain behind ear into arm and back on the right side. Pain is constant. Reports increased pain with moving head to the left feels a sharper pain in the middle of neck. Denies mechanism of injury, trauma. Felt cramping in her lower jaw while eating and then pain began. Denies vision changes. Today has had nausea and dizziness in the morning. Dizziness is described as presyncope. Denies chest pain or shortness of breath.  Denies tobacco use. Denies history of any heart problems.  HPI  History reviewed. No pertinent past medical history.  There are no problems to display for this patient.   History reviewed. No pertinent surgical history.  OB History   No obstetric history on file.      Home Medications    Prior to Admission medications   Medication Sig Start Date End Date Taking? Authorizing Provider  fluorometholone (FML) 0.1 % ophthalmic suspension Place 1 drop into both eyes daily.    [provider]  ibuprofen (ADVIL,MOTRIN) 200 MG tablet Take 200-400 mg by mouth every 6 (six) hours as needed (for pain or headaches).     [provider]  medroxyPROGESTERone (DEPO-PROVERA) 150 MG/ML injection Inject 150 mg into the muscle every 3 (three) months.     [provider]  naproxen (NAPROSYN) 500 MG tablet Take 1 tablet (500 mg total) by mouth 2 (two) times daily. 01/02/20   Jazmina Muhlenkamp C, PA-C  timolol (BETIMOL) 0.5 % ophthalmic solution Place 1 drop into both eyes 2 (two) times daily.    [provider]  tiZANidine (ZANAFLEX) 4 MG tablet Take 1 tablet (4 mg total) by mouth every 6 (six) hours as needed for muscle spasms. 01/02/20    Zakiah Beckerman C, PA-C  omeprazole (PRILOSEC) 20 MG capsule Take 1 capsule (20 mg total) by mouth daily. Patient not taking: Reported on 04/22/2018 04/10/18 01/02/20  Wardell Honour, MD    Family History History reviewed. No pertinent family history.  Social History Social History   Tobacco Use  . Smoking status: Never Smoker  . Smokeless tobacco: Never Used  Vaping Use  . Vaping Use: Never used  Substance Use Topics  . Alcohol use: Not Currently  . Drug use: Never     Allergies   Patient has no known allergies.   Review of Systems Review of Systems  Constitutional: Negative for fatigue and fever.  HENT: Negative for mouth sores.   Eyes: Negative for visual disturbance.  Respiratory: Negative for shortness of breath.   Cardiovascular: Negative for chest pain.  Gastrointestinal: Positive for nausea. Negative for abdominal pain and vomiting.  Musculoskeletal: Positive for back pain, myalgias and neck pain. Negative for arthralgias and joint swelling.  Skin: Negative for color change, rash and wound.  Neurological: Positive for dizziness. Negative for weakness, light-headedness and headaches.     Physical Exam Triage Vital Signs ED Triage Vitals [01/02/20 1648]  Enc Vitals Group     BP      Pulse      Resp      Temp      Temp src      SpO2  Weight      Height      Head Circumference      Peak Flow      Pain Score 6     Pain Loc      Pain Edu?      Excl. in [place]?    No data found.  Updated Vital Signs BP 132/86 (BP Location: Right Arm)   Pulse 82   Temp 98.5 F (36.9 C) (Oral)   Resp 19   SpO2 99%   Visual Acuity Right Eye Distance:   Left Eye Distance:   Bilateral Distance:    Right Eye Near:   Left Eye Near:    Bilateral Near:     Physical Exam Vitals and nursing note reviewed.  Constitutional:      Appearance: She is well-developed.     Comments: No acute distress  HENT:     Head: Normocephalic and atraumatic.     Ears:      Comments: Bilateral ears without tenderness to palpation of external auricle, tragus and mastoid, EAC's without erythema or swelling, TM's with good bony landmarks and cone of light. Non erythematous.    Nose: Nose normal.     Mouth/Throat:     Comments: Oral mucosa pink and moist, no tonsillar enlargement or exudate. Posterior pharynx patent and nonerythematous, no uvula deviation or swelling. Normal phonation. Eyes:     Extraocular Movements: Extraocular movements intact.     Conjunctiva/sclera: Conjunctivae normal.     Pupils: Pupils are equal, round, and reactive to light.  Cardiovascular:     Rate and Rhythm: Normal rate.  Pulmonary:     Effort: Pulmonary effort is normal. No respiratory distress.     Comments: Breathing comfortably at rest, CTABL, no wheezing, rales or other adventitious sounds auscultated Abdominal:     General: There is no distension.  Musculoskeletal:        General: Normal range of motion.     Cervical back: Neck supple.     Comments: Cervical spine nontender to palpation midline, tenderness throughout right cervical and superior thoracic musculature, full active range of motion of right shoulder  Slight tenderness to palpation to anterior neck, no carotid bruits auscultated  Skin:    General: Skin is warm and dry.  Neurological:     Mental Status: She is alert and oriented to person, place, and time.      UC Treatments / Results  Labs (all labs ordered are listed, but only abnormal results are displayed) Labs Reviewed - No data to display  EKG   Radiology No results found.  Procedures Procedures (including critical care time)  Medications Ordered in UC Medications - No data to display  Initial Impression / Assessment and Plan / UC Course  I have reviewed the triage vital signs and the nursing notes.  Pertinent labs & imaging results that were available during my care of the patient were reviewed by me and considered in my medical decision  making (see chart for details).     Suspect most likely MSK etiology, no neuro deficits, recommend anti-inflammatories and muscle relaxers. Did discuss with patient if developing worsening dizziness, nausea to follow-up in emergency room for further imaging, possible CT scanning. Gentle stretching.  Discussed strict return precautions. Patient verbalized understanding and is agreeable with plan.  Final Clinical Impressions(s) / UC Diagnoses   Final diagnoses:  Acute strain of neck muscle, initial encounter     Discharge Instructions     Naprosyn twice daily  with food Tizanidine to supplement- may cause drowsiness Gentle stretching Follow up if not improving or worsening    ED Prescriptions    Medication Sig Dispense Auth. Provider   naproxen (NAPROSYN) 500 MG tablet Take 1 tablet (500 mg total) by mouth 2 (two) times daily. 30 tablet Kylia Grajales C, PA-C   tiZANidine (ZANAFLEX) 4 MG tablet Take 1 tablet (4 mg total) by mouth every 6 (six) hours as needed for muscle spasms. 30 tablet Hezakiah Champeau, Sully C, PA-C     PDMP not reviewed this encounter.   Janith Lima, PA-C 01/02/20 1800    Janith Lima, PA-C 01/02/20 1802

## 2020-01-02 NOTE — ED Triage Notes (Signed)
Pt states she has had neck pain on her right side since Saturday. Pt states the pain radiates down her shoulder. Pt said she noticed it while eating super. Pt si aox4 and ambulatory.     Marland Kitchen

## 2020-01-02 NOTE — Discharge Instructions (Signed)
Naprosyn twice daily with food Tizanidine to supplement- may cause drowsiness Gentle stretching Follow up if not improving or worsening

## 2020-01-20 ENCOUNTER — Emergency Department (HOSPITAL_COMMUNITY): Payer: Self-pay

## 2020-01-20 ENCOUNTER — Other Ambulatory Visit: Payer: Self-pay

## 2020-01-20 ENCOUNTER — Emergency Department (HOSPITAL_COMMUNITY)
Admission: EM | Admit: 2020-01-20 | Discharge: 2020-01-20 | Disposition: A | Discharge status: Home / Self Care | Payer: Self-pay | Attending: Emergency Medicine | Admitting: Emergency Medicine

## 2020-01-20 DIAGNOSIS — R1900 Intra-abdominal and pelvic swelling, mass and lump, unspecified site: Secondary | ICD-10-CM

## 2020-01-20 DIAGNOSIS — K429 Umbilical hernia without obstruction or gangrene: Secondary | ICD-10-CM | POA: Insufficient documentation

## 2020-01-20 LAB — COMPREHENSIVE METABOLIC PANEL
ALT: 29 U/L (ref 0–44)
AST: 22 U/L (ref 15–41)
Albumin: 3.7 g/dL (ref 3.5–5.0)
Alkaline Phosphatase: 77 U/L (ref 38–126)
Anion gap: 12 (ref 5–15)
BUN: 15 mg/dL (ref 6–20)
CO2: 23 mmol/L (ref 22–32)
Calcium: 9.3 mg/dL (ref 8.9–10.3)
Chloride: 104 mmol/L (ref 98–111)
Creatinine, Ser: 0.87 mg/dL (ref 0.44–1.00)
GFR, Estimated: >60 mL/min (ref >60)
Glucose, Bld: 232 mg/dL — ABNORMAL HIGH (ref 70–99)
Potassium: 3.3 mmol/L — ABNORMAL LOW (ref 3.5–5.1)
Sodium: 139 mmol/L (ref 135–145)
Total Bilirubin: 0.6 mg/dL (ref 0.3–1.2)
Total Protein: 7.3 g/dL (ref 6.5–8.1)

## 2020-01-20 LAB — URINALYSIS, ROUTINE W REFLEX MICROSCOPIC
Bacteria, UA: NONE SEEN
Bilirubin Urine: NEGATIVE
Glucose, UA: 50 mg/dL — AB
Ketones, ur: NEGATIVE mg/dL
Leukocytes,Ua: NEGATIVE
Nitrite: NEGATIVE
Protein, ur: 100 mg/dL — AB
Specific Gravity, Urine: 1.026 (ref 1.005–1.030)
pH: 5 (ref 5.0–8.0)

## 2020-01-20 LAB — CBC
HCT: 39.7 % (ref 36.0–46.0)
Hemoglobin: 12.9 g/dL (ref 12.0–15.0)
MCH: 28.7 pg (ref 26.0–34.0)
MCHC: 32.5 g/dL (ref 30.0–36.0)
MCV: 88.4 fL (ref 80.0–100.0)
Platelets: 311 10*3/uL (ref 150–400)
RBC: 4.49 MIL/uL (ref 3.87–5.11)
RDW: 13.2 % (ref 11.5–15.5)
WBC: 10.5 10*3/uL (ref 4.0–10.5)
nRBC: 0 % (ref 0.0–0.2)

## 2020-01-20 LAB — LIPASE, BLOOD: Lipase: 44 U/L (ref 11–51)

## 2020-01-20 LAB — I-STAT BETA HCG BLOOD, ED (MC, WL, AP ONLY): I-stat hCG, quantitative: <5 m[IU]/mL (ref <5)

## 2020-01-20 MED ORDER — POTASSIUM CHLORIDE CRYS ER 20 MEQ PO TBCR
40.0000 meq | EXTENDED_RELEASE_TABLET | Freq: Once | ORAL | Status: AC
  Administered 2020-01-20: 40 meq via ORAL
  Filled 2020-01-20: qty 2

## 2020-01-20 MED ORDER — IOHEXOL 300 MG/ML  SOLN
100.0000 mL | Freq: Once | INTRAMUSCULAR | Status: AC | PRN
  Administered 2020-01-20: 100 mL via INTRAVENOUS

## 2020-01-20 MED ORDER — ACETAMINOPHEN 500 MG PO TABS
1000.0000 mg | ORAL_TABLET | Freq: Once | ORAL | Status: AC
  Administered 2020-01-20: 1000 mg via ORAL
  Filled 2020-01-20: qty 2

## 2020-01-20 NOTE — Discharge Instructions (Addendum)
It was our pleasure to provide your ER care today - we hope that you feel better.  Your ct scan was read as follows: IMPRESSION: 1. Indeterminate 1.3 x 1.8 cm soft tissue density lesion anterior to the superior pole the left kidney of unclear etiology. Finding could be related to the kidney or adrenal gland, versus represent a lymph node. Recommend renal protocol MRI for further evaluation. 2. Tiny fat containing umbilical hernia with an abdominal wall defect of 0.7 cm. No associated organized fluid collection or findings suggestive of inflammation.   For hernia, follow up with general surgeon in the next 1-2 weeks - call office tomorrow AM to arrange appointment.   As relates the 1.3 by 1.8 cm 'density' noted on the scan - discuss with primary care doctor in the next 1-2 weeks - and have them arrange a 'renal protocol MRI scan'.   From today's lab tests, your potassium level is slightly low (3.3) - eat plenty of fruits and vegetables, and follow up with your doctor.   Take acetaminophen or ibuprofen as need.   Return to ER if worse, new symptoms, fevers, worsening or severe pain, persistent vomiting, or other concern.

## 2020-01-20 NOTE — ED Triage Notes (Signed)
C/o abdominal pain x 1 week; stated on Tuesday she had some blood and pus comingout of her belly around the belly button. Denies PMH.

## 2020-01-20 NOTE — ED Provider Notes (Signed)
[facility]PART[facility] Provider Note   CSN: [id number] Arrival date & time: 01/20/20  1525     History Chief Complaint  Patient presents with   Abdominal Pain    Allison Monroe is a 34 y.o. female.  Patient c/o mid abd pain for the past 1-2 weeks. Symptoms acute onset, moderate, dull, mid abd/peri-umbilical area, not radiating, constant. No hx same pain. No associated fever or chills. No vomiting. Is having normal bms. No back or flank pain. No dysuria. No vaginal discharge or bleeding. No prior abd surgery. No hx hernia. States had noted small amount of blood/pus from umbilicus. No trauma or fb to area.   The history is provided by the patient.  Abdominal Pain Associated symptoms: no chest pain, no dysuria, no fever, no shortness of breath, no sore throat and no vomiting        No past medical history on file.  There are no problems to display for this patient.   No past surgical history on file.   OB History   No obstetric history on file.     No family history on file.  Social History   Tobacco Use   Smoking status: Never Smoker   Smokeless tobacco: Never Used  Vaping Use   Vaping Use: Never used  Substance Use Topics   Alcohol use: Not Currently   Drug use: Never    Home Medications Prior to Admission medications   Medication Sig Start Date End Date Taking? Authorizing Provider  fluorometholone (FML) 0.1 % ophthalmic suspension Place 1 drop into both eyes daily.    [provider]  ibuprofen (ADVIL,MOTRIN) 200 MG tablet Take 200-400 mg by mouth every 6 (six) hours as needed (for pain or headaches).     [provider]  medroxyPROGESTERone (DEPO-PROVERA) 150 MG/ML injection Inject 150 mg into the muscle every 3 (three) months.     [provider]  naproxen (NAPROSYN) 500 MG tablet Take 1 tablet (500 mg total) by mouth 2 (two) times daily. 01/02/20   Wieters, Hallie C, PA-C  timolol  (BETIMOL) 0.5 % ophthalmic solution Place 1 drop into both eyes 2 (two) times daily.    [provider]  tiZANidine (ZANAFLEX) 4 MG tablet Take 1 tablet (4 mg total) by mouth every 6 (six) hours as needed for muscle spasms. 01/02/20   Wieters, Hallie C, PA-C  omeprazole (PRILOSEC) 20 MG capsule Take 1 capsule (20 mg total) by mouth daily. Patient not taking: Reported on 04/22/2018 04/10/18 01/02/20  Wardell Honour, MD    Allergies    Patient has no known allergies.  Review of Systems   Review of Systems  Constitutional: Negative for fever.  HENT: Negative for sore throat.   Eyes: Negative for redness.  Respiratory: Negative for shortness of breath.   Cardiovascular: Negative for chest pain.  Gastrointestinal: Positive for abdominal pain. Negative for vomiting.  Genitourinary: Negative for dysuria and flank pain.  Musculoskeletal: Negative for back pain.  Skin: Negative for rash.  Neurological: Negative for headaches.  Hematological: Does not bruise/bleed easily.  Psychiatric/Behavioral: Negative for confusion.    Physical Exam Updated Vital Signs BP (!) 141/60 (BP Location: Left Arm)    Pulse 66    Temp 98.1 F (36.7 C) (Oral)    Resp 16    SpO2 100%   Physical Exam Vitals and nursing note reviewed.  Constitutional:      Appearance: Normal appearance. She is well-developed.  HENT:  Head: Atraumatic.     Nose: Nose normal.     Mouth/Throat:     Mouth: Mucous membranes are moist.  Eyes:     General: No scleral icterus.    Conjunctiva/sclera: Conjunctivae normal.  Neck:     Trachea: No tracheal deviation.  Cardiovascular:     Rate and Rhythm: Normal rate and regular rhythm.     Pulses: Normal pulses.     Heart sounds: Normal heart sounds. No murmur heard.  No friction rub. No gallop.   Pulmonary:     Effort: Pulmonary effort is normal. No respiratory distress.     Breath sounds: Normal breath sounds.  Abdominal:     General: Bowel sounds are normal. There  is no distension.     Palpations: Abdomen is soft.     Tenderness: There is abdominal tenderness. There is no guarding or rebound.     Hernia: No hernia is present.     Comments: Mid abd and periumbilical tenderness. No incarcerated hernia felt. Umbilicus appears grossly normal. No abscess. No skin changes, erythema, cellulitis, or crepitus. No drainage, pus, or bleeding from umbilicus.   Genitourinary:    Comments: No cva tenderness.  Musculoskeletal:        General: No swelling.     Cervical back: Normal range of motion and neck supple. No rigidity. No muscular tenderness.  Skin:    General: Skin is warm and dry.     Findings: No rash.  Neurological:     Mental Status: She is alert.     Comments: Alert, speech normal.   Psychiatric:        Mood and Affect: Mood normal.     ED Results / Procedures / Treatments   Labs (all labs ordered are listed, but only abnormal results are displayed) Results for orders placed or performed during the hospital encounter of 01/20/20  Lipase, blood  Result Value Ref Range   Lipase 44 11 - 51 U/L  Comprehensive metabolic panel  Result Value Ref Range   Sodium 139 135 - 145 mmol/L   Potassium 3.3 (L) 3.5 - 5.1 mmol/L   Chloride 104 98 - 111 mmol/L   CO2 23 22 - 32 mmol/L   Glucose, Bld 232 (H) 70 - 99 mg/dL   BUN 15 6 - 20 mg/dL   Creatinine, Ser 0.87 0.44 - 1.00 mg/dL   Calcium 9.3 8.9 - 10.3 mg/dL   Total Protein 7.3 6.5 - 8.1 g/dL   Albumin 3.7 3.5 - 5.0 g/dL   AST 22 15 - 41 U/L   ALT 29 0 - 44 U/L   Alkaline Phosphatase 77 38 - 126 U/L   Total Bilirubin 0.6 0.3 - 1.2 mg/dL   GFR, Estimated >60 >60 mL/min   Anion gap 12 5 - 15  CBC  Result Value Ref Range   WBC 10.5 4.0 - 10.5 K/uL   RBC 4.49 3.87 - 5.11 MIL/uL   Hemoglobin 12.9 12.0 - 15.0 g/dL   HCT 39.7 36 - 46 %   MCV 88.4 80.0 - 100.0 fL   MCH 28.7 26.0 - 34.0 pg   MCHC 32.5 30.0 - 36.0 g/dL   RDW 13.2 11.5 - 15.5 %   Platelets 311 150 - 400 K/uL   nRBC 0.0 0.0 - 0.2 %    Urinalysis, Routine w reflex microscopic  Result Value Ref Range   Color, Urine YELLOW YELLOW   APPearance CLEAR CLEAR   Specific Gravity, Urine 1.026 1.005 -  1.030   pH 5.0 5.0 - 8.0   Glucose, UA 50 (A) NEGATIVE mg/dL   Hgb urine dipstick SMALL (A) NEGATIVE   Bilirubin Urine NEGATIVE NEGATIVE   Ketones, ur NEGATIVE NEGATIVE mg/dL   Protein, ur 100 (A) NEGATIVE mg/dL   Nitrite NEGATIVE NEGATIVE   Leukocytes,Ua NEGATIVE NEGATIVE   RBC / HPF 0-5 0 - 5 RBC/hpf   WBC, UA 0-5 0 - 5 WBC/hpf   Bacteria, UA NONE SEEN NONE SEEN   Squamous Epithelial / LPF 0-5 0 - 5   Mucus PRESENT   I-Stat beta hCG blood, ED  Result Value Ref Range   I-stat hCG, quantitative <5.0 <5 mIU/mL   Comment 3           EKG None  Radiology CT Abdomen Pelvis W Contrast  Result Date: 01/20/2020 CLINICAL DATA:  Abdominal pain, acute, nonlocalized. On Tuesday had some blood and pus coming out of her belly button. EXAM: CT ABDOMEN AND PELVIS WITH CONTRAST TECHNIQUE: Multidetector CT imaging of the abdomen and pelvis was performed using the standard protocol following bolus administration of intravenous contrast. CONTRAST:  187mL OMNIPAQUE IOHEXOL 300 MG/ML  SOLN COMPARISON:  None. FINDINGS: Lower chest: Couple scattered calcified granuloma of the lung bases. No acute abnormality. Hepatobiliary: No focal liver abnormality. No gallstones, gallbladder wall thickening, or pericholecystic fluid. No biliary dilatation. Pancreas: No focal lesion. Normal pancreatic contour. No surrounding inflammatory changes. No main pancreatic ductal dilatation. Spleen: Normal in size without focal abnormality. Adrenals/Urinary Tract: No adrenal nodule bilaterally. Bilateral kidneys enhance symmetrically. No hydronephrosis. No hydroureter. The urinary bladder is unremarkable. Stomach/Bowel: Stomach is within normal limits. No evidence of bowel wall thickening or dilatation. Few scattered colonic diverticula. Appendix appears normal.  Vascular/Lymphatic: No significant vascular findings are present. No definite enlarged abdominal or pelvic lymph nodes. Reproductive: Uterus and bilateral adnexa are unremarkable. Other: No intraperitoneal free fluid. No intraperitoneal free gas. No organized fluid collection. Musculoskeletal: Tiny fat containing umbilical hernia with an abdominal defect of 0.7 cm. Nonspecific subcentimeter severe soft tissue densities along the anterior abdominal wall open (3:49, 51). No organized fluid collection along the paraumbilical region. No associated fat stranding. No suspicious lytic or blastic osseous lesions. No acute displaced fracture. IMPRESSION: 1. Indeterminate 1.3 x 1.8 cm soft tissue density lesion anterior to the superior pole the left kidney of unclear etiology. Finding could be related to the kidney or adrenal gland, versus represent a lymph node. Recommend renal protocol MRI for further evaluation. 2. Tiny fat containing umbilical hernia with an abdominal wall defect of 0.7 cm. No associated organized fluid collection or findings suggestive of inflammation. Electronically Signed   By: Iven Finn M.D.   On: 01/20/2020 22:02    Procedures Procedures (including critical care time)  Medications Ordered in ED Medications - No data to display  ED Course  I have reviewed the triage vital signs and the nursing notes.  Pertinent labs & imaging results that were available during my care of the patient were reviewed by me and considered in my medical decision making (see chart for details).    MDM Rules/Calculators/A&P                         Iv ns. Stat labs. Acetaminophen po.   Reviewed nursing notes and prior charts for additional history.   Labs reviewed/interpreted by me - wbc 10.5, upper normal. k mildly low, kcl po.   Pain/tenderness persists. Will get ct.  CT with small umbilical hernia. Also incidental note made of small adrenal/renal density - shared w pt - will have f/u as  outpt.     Final Clinical Impression(s) / ED Diagnoses Final diagnoses:  None    Rx / DC Orders ED Discharge Orders    None       Lajean Saver, MD 01/20/20 2221

## 2020-02-04 ENCOUNTER — Ambulatory Visit (INDEPENDENT_AMBULATORY_CARE_PROVIDER_SITE_OTHER): Payer: Self-pay | Admitting: Primary Care

## 2020-02-04 ENCOUNTER — Ambulatory Visit: Payer: Self-pay | Attending: Family Medicine | Admitting: Family Medicine

## 2020-02-04 ENCOUNTER — Other Ambulatory Visit: Payer: Self-pay | Admitting: Family Medicine

## 2020-02-04 ENCOUNTER — Encounter: Payer: Self-pay | Admitting: Family Medicine

## 2020-02-04 ENCOUNTER — Other Ambulatory Visit: Payer: Self-pay

## 2020-02-04 VITALS — BP 122/83 | HR 77 | Ht 63.35 in | Wt 197.8 lb

## 2020-02-04 DIAGNOSIS — Z789 Other specified health status: Secondary | ICD-10-CM

## 2020-02-04 DIAGNOSIS — K429 Umbilical hernia without obstruction or gangrene: Secondary | ICD-10-CM

## 2020-02-04 DIAGNOSIS — Z603 Acculturation difficulty: Secondary | ICD-10-CM

## 2020-02-04 DIAGNOSIS — R1900 Intra-abdominal and pelvic swelling, mass and lump, unspecified site: Secondary | ICD-10-CM

## 2020-02-04 DIAGNOSIS — H409 Unspecified glaucoma: Secondary | ICD-10-CM

## 2020-02-04 DIAGNOSIS — E119 Type 2 diabetes mellitus without complications: Secondary | ICD-10-CM

## 2020-02-04 DIAGNOSIS — E876 Hypokalemia: Secondary | ICD-10-CM

## 2020-02-04 DIAGNOSIS — Z23 Encounter for immunization: Secondary | ICD-10-CM

## 2020-02-04 DIAGNOSIS — Z758 Other problems related to medical facilities and other health care: Secondary | ICD-10-CM

## 2020-02-04 LAB — GLUCOSE, POCT (MANUAL RESULT ENTRY): POC Glucose: 145 mg/dL — AB (ref 70–99)

## 2020-02-04 LAB — POCT GLYCOSYLATED HEMOGLOBIN (HGB A1C): Hemoglobin A1C: 6.5 % — AB (ref 4.0–5.6)

## 2020-02-04 MED ORDER — TRUEPLUS LANCETS 28G MISC: 11 refills | Status: DC

## 2020-02-04 MED ORDER — METFORMIN HCL 500 MG PO TABS: ORAL_TABLET | ORAL | 3 refills | Status: DC

## 2020-02-04 MED ORDER — TRUE METRIX BLOOD GLUCOSE TEST VI STRP: ORAL_STRIP | 12 refills | Status: DC

## 2020-02-04 MED ORDER — TRUE METRIX METER W/DEVICE KIT: PACK | 0 refills | Status: AC

## 2020-02-04 MED FILL — METFORMIN HCL 500 MG TABS: 500 | 30 days supply | Qty: 60 | Fill #0

## 2020-02-04 MED FILL — TRUEplus LANCETS 28G MISC: 30 days supply | Qty: 100 | Fill #0

## 2020-02-04 MED FILL — TRUE METRIX TEST STRIP: 30 days supply | Qty: 100 | Fill #0

## 2020-02-04 MED FILL — !TRUE METRIX BLOOD GLUCOSE: 1 days supply | Qty: 1 | Fill #0

## 2020-02-04 NOTE — Progress Notes (Signed)
ED F/U FROM 11/2-RENAL MASS Timmothy Euler DIAG PT [facility] 05/03/18 FHX OF DIABETES

## 2020-02-04 NOTE — Progress Notes (Signed)
New Patient Office Visit  Subjective:  Patient ID: Allison Monroe, female    DOB: 1986-01-01  Age: 34 y.o. MRN: [id number]  Due to language barrier, patient is accompanied by an in-person Spanish-speaking interpreter at today's visit  CC:  Chief Complaint  Patient presents with  . Establish Care    HPI Allison Monroe, 34 year old female new to the practice, who presents to establish care and follow-up of emergency department visit on 01/20/2020 when patient presented secondary to abdominal pain and patient had presence of a tiny, fat-containing umbilical hernia seen on CT scan.  Patient also had on CT scan, an indeterminate 1.3 x 1.8 cm soft tissue density lesion anterior to the superior pole of the left kidney of unclear etiology.  Finding could be related to the kidney or adrenal gland, versus representing as lymph node.  Renal protocol MRI was recommended for further evaluation.  At today's visit, patient reports that she is having recurrent pain at the area of her umbilical hernia.  She reports that when she is sitting or standing, she feels a pulling sensation.  She has seen a Psychologist, sport and exercise once, at [facility] surgery regarding her hernia but was told that she needed to have the MRI first to find out what was going on with the mass in her abdomen.   Patient also with complaint of having urinary frequency for over a year.  She reports that she is awakened at night with the urge to urinate and she occasionally has some increased thirst.  She does have family history significant for diabetes.  She denies any issues of blurred vision.  She was unaware that at her recent emergency department visit on 11-21 that her glucose was elevated at 232.  Patient also had potassium of 3.3.  Past Medical History:  Diagnosis Date  . No pertinent past medical history     Past Surgical History:  Procedure Laterality Date  . NO PAST SURGERIES      Family History  Problem Relation Age of  Onset  . Hypertension Mother   . Diabetes Father     Social History   Socioeconomic History  . Marital status: Single    Spouse name: Not on file  . Number of children: Not on file  . Years of education: Not on file  . Highest education level: Not on file  Occupational History  . Not on file  Tobacco Use  . Smoking status: Never Smoker  . Smokeless tobacco: Never Used  Vaping Use  . Vaping Use: Never used  Substance and Sexual Activity  . Alcohol use: Not Currently  . Drug use: Never  . Sexual activity: Yes  Other Topics Concern  . Not on file  Social History Narrative  . Not on file   Social Determinants of Health   Financial Resource Strain:   . Difficulty of Paying Living Expenses: Not on file  Food Insecurity:   . Worried About Charity fundraiser in the Last Year: Not on file  . Ran Out of Food in the Last Year: Not on file  Transportation Needs:   . Lack of Transportation (Medical): Not on file  . Lack of Transportation (Non-Medical): Not on file  Physical Activity:   . Days of Exercise per Week: Not on file  . Minutes of Exercise per Session: Not on file  Stress:   . Feeling of Stress : Not on file  Social Connections:   . Frequency of Communication with Friends and Family:  Not on file  . Frequency of Social Gatherings with Friends and Family: Not on file  . Attends Religious Services: Not on file  . Active Member of Clubs or Organizations: Not on file  . Attends Archivist Meetings: Not on file  . Marital Status: Not on file  Intimate Partner Violence:   . Fear of Current or Ex-Partner: Not on file  . Emotionally Abused: Not on file  . Physically Abused: Not on file  . Sexually Abused: Not on file    ROS Review of Systems  Constitutional: Positive for fatigue. Negative for chills and fever.  HENT: Negative for sore throat and trouble swallowing.   Eyes: Positive for visual disturbance. Negative for photophobia.  Gastrointestinal:  Positive for abdominal pain. Negative for blood in stool, constipation, diarrhea and nausea.  Endocrine: Positive for polyuria. Negative for polydipsia and polyphagia.  Genitourinary: Positive for frequency. Negative for dysuria.  Musculoskeletal: Negative for arthralgias and back pain.  Neurological: Negative for dizziness and headaches.  Hematological: Negative for adenopathy. Does not bruise/bleed easily.  Psychiatric/Behavioral: Negative for suicidal ideas. The patient is not nervous/anxious.     Objective:   Today's Vitals: BP 122/83 (BP Location: Left Arm, Patient Position: Sitting)   Pulse 77   Ht 5' 3.35" (1.609 m)   Wt 197 lb 12.8 oz (89.7 kg)   SpO2 100%   BMI 34.66 kg/m   Physical Exam Vitals and nursing note reviewed.  Constitutional:      General: She is not in acute distress.    Appearance: Normal appearance. She is obese.     Comments: WNWD overweight female in NAD who is accompanied by an interpreter at today's visit.  Cardiovascular:     Rate and Rhythm: Normal rate and regular rhythm.  Pulmonary:     Effort: Pulmonary effort is normal.     Breath sounds: Normal breath sounds.  Abdominal:     Palpations: Abdomen is soft.     Tenderness: There is abdominal tenderness (tenderness to palp at the right upper portion of the abdomen near the umbilicus). There is no right CVA tenderness, left CVA tenderness, guarding or rebound.  Musculoskeletal:        General: No tenderness.     Cervical back: Normal range of motion and neck supple.     Right lower leg: No edema.     Left lower leg: No edema.  Lymphadenopathy:     Cervical: No cervical adenopathy.  Skin:    General: Skin is warm and dry.  Neurological:     General: No focal deficit present.     Mental Status: She is alert and oriented to person, place, and time.  Psychiatric:        Mood and Affect: Mood normal.        Behavior: Behavior normal.     Assessment & Plan:  1. Intra-abdominal and pelvic  swelling, mass and lump, unspecified site Patient with CT scan done on 01/20/2020 showing an indeterminate 1.3 x 1.8 cm soft tissue density lesion anterior to the superior pole of the left kidney of unclear etiology.  Renal protocol MRI was suggested at follow-up.  Order was placed for MRI of the abdomen.  Patient is encouraged to apply for the [facility] financial discount program to help with ongoing medical cost. - MR Abdomen W Wo Contrast; Future  2. Umbilical hernia without obstruction and without gangrene Referral will be placed to general surgery and follow-up with patient's umbilical hernia seen on  recent CT scan done in the emergency department as patient reports that she has continued issues with pain at this area. - Ambulatory referral to General Surgery  3. New onset type 2 diabetes mellitus ([place]) Patient's hemoglobin A1c at today's visit is 6.5 consistent with a diagnosis of type 2 diabetes.  Patient was given information in Spanish on diabetes basics and carbohydrate counting.  She was also provided with diabetic testing supplies and has been asked to start Metformin initially at 1 pill after the evening meal and then increase to 1 p.o. twice daily if well tolerated.  She has also been asked to follow-up with the clinical pharmacist in a few weeks for diabetic education and help with making sure that she can properly monitor her blood sugars. - POCT glucose (manual entry) - POCT glycosylated hemoglobin (Hb D2K) - Basic Metabolic Panel - metFORMIN (GLUCOPHAGE) 500 MG tablet; One pill after the evening meal for one week then increase to twice per day  Dispense: 60 tablet; Refill: 3 - Blood Glucose Monitoring Suppl (TRUE METRIX METER) w/Device KIT; Use as directed to check blood sugars  Dispense: 1 kit; Refill: 0 - glucose blood (TRUE METRIX BLOOD GLUCOSE TEST) test strip; Use as instructed to initially check blood sugars twice per day- fasting in the morning and before bedtime  Dispense: 100  each; Refill: 12 - TRUEplus Lancets 28G MISC; Use to check blood sugars  Dispense: 100 each; Refill: 11  4. Hypokalemia Patient with hypokalemia with potassium of 3.3 at recent ED visit and she will have potassium rechecked as part of BMP at today's visit - Basic Metabolic Panel  5. Glaucoma of both eyes, unspecified glaucoma type Patient reports that she has glaucoma.  Ophthalmology referral placed however I am not sure that there are any ophthalmologist associated with the [facility] financial discount program therefore patient was also provided with a list of eye care providers with whom she can follow-up.  6. Need for immunization against influenza Patient was offered and agreed to have influenza immunization at today's visit.  Patient also received educational material regarding influenza immunization in Spanish at today's visit - Flu Vaccine QUAD 36+ mos IM  7.  Language barrier Patient was accompanied by an in person interpreter at today's visit to help with language barrier  Outpatient Encounter Medications as of 02/04/2020  Medication Sig  . ibuprofen (ADVIL,MOTRIN) 200 MG tablet Take 200-400 mg by mouth every 6 (six) hours as needed (for pain or headaches).   . medroxyPROGESTERone (DEPO-PROVERA) 150 MG/ML injection Inject 150 mg into the muscle every 3 (three) months.   . fluorometholone (FML) 0.1 % ophthalmic suspension Place 1 drop into both eyes daily. (Patient not taking: Reported on 02/04/2020)  . naproxen (NAPROSYN) 500 MG tablet Take 1 tablet (500 mg total) by mouth 2 (two) times daily. (Patient not taking: Reported on 02/04/2020)  . timolol (BETIMOL) 0.5 % ophthalmic solution Place 1 drop into both eyes 2 (two) times daily. (Patient not taking: Reported on 02/04/2020)  . tiZANidine (ZANAFLEX) 4 MG tablet Take 1 tablet (4 mg total) by mouth every 6 (six) hours as needed for muscle spasms. (Patient not taking: Reported on 02/04/2020)  . [DISCONTINUED] omeprazole (PRILOSEC) 20 MG  capsule Take 1 capsule (20 mg total) by mouth daily. (Patient not taking: Reported on 04/22/2018)   No facility-administered encounter medications on file as of 02/04/2020.    Follow-up: Return in about 6 weeks (around 03/17/2020) for chronic issues; 2-3 week f/u with Luke/CPP for DM.  Antony Blackbird, MD

## 2020-02-04 NOTE — Patient Instructions (Signed)
Informacin bsica sobre la diabetes Diabetes Basics  La diabetes (diabetes mellitus) es una enfermedad de larga duracin (crnica). Se produce cuando el cuerpo no utiliza Occupational hygienist (glucosa) que se libera de los alimentos despus de comer. La diabetes puede deberse a uno de Mirant o a ambos:  El pncreas no produce suficiente cantidad de una hormona llamada insulina.  El cuerpo no reacciona de forma normal a la insulina que produce. La insulina permite que ciertos azcares (glucosa) ingresen a las clulas del cuerpo. Esto le proporciona energa. Si tiene diabetes, los azcares no pueden ingresar a las clulas. Esto produce un aumento del nivel de Dispensing optician (hiperglucemia). Sigue estas instrucciones en tu casa: Cmo se trata la diabetes? Es posible que tenga que administrarse insulina u otros medicamentos para la diabetes todos los [place] para mantener el nivel de Location manager en la sangre equilibrado. Adminstrese los medicamentos para la diabetes todos los [place] se lo haya indicado el mdico. Haga una lista de los medicamentos para la diabetes aqu: Medicamentos para la diabetes  Nombre del medicamento: ______________________________ ? Cantidad (dosis): ________________ Mellody Drown (a.m./p.m.): _______________ Wilford Grist: ___________________________________  Micki Riley medicamento: ______________________________ ? Cantidad (dosis): ________________ Mellody Drown (a.m./p.m.): _______________ Wilford Grist: ___________________________________  Micki Riley medicamento: ______________________________ ? Cantidad (dosis): ________________ Mellody Drown (a.m./p.m.): _______________ Wilford Grist: ___________________________________ Si [place] insulina, aprender cmo aplicrsela con inyecciones. Es posible que deba ajustar la cantidad en funcin de los alimentos que coma. Haga una lista de los tipos de [place]o [place] aqu: Insulina  Tipo de insulina: ______________________________ ? Cantidad (dosis):  ________________ Mellody Drown (a.m./p.m.): _______________ Wilford Grist: ___________________________________  Suezanne Jacquet: ______________________________ ? Cantidad (dosis): ________________ Mellody Drown (a.m./p.m.): _______________ Wilford Grist: ___________________________________  Suezanne Jacquet: ______________________________ ? Cantidad (dosis): ________________ Mellody Drown (a.m./p.m.): _______________ Wilford Grist: ___________________________________  Suezanne Jacquet: ______________________________ ? Cantidad (dosis): ________________ Mellody Drown (a.m./p.m.): _______________ Wilford Grist: ___________________________________  Suezanne Jacquet: ______________________________ ? Cantidad (dosis): ________________ Mellody Drown (a.m./p.m.): _______________ Wilford Grist: ___________________________________ Cmo me controlo el nivel de azcar en la sangre?  Controle sus niveles de azcar en la sangre con un medidor de glucemia segn las indicaciones del mdico. El mdico fijar los objetivos del tratamiento para usted. Generalmente, los resultados de los niveles de azcar en la sangre deben ser los siguientes:  Antes de las comidas (preprandial): de 80 a 133m/dl (de 4,4 a 7,284ml/l).  Despus de las comidas (posprandial): por debajo de 18049ml (52m65ml).  Nivel de A1c: menos del 7%. Anote las veces que se controlar los niveles de azcar en la sangre: Controles de azcar en la sangre  Hora: _______________ NotaWilford Grist_________________________________  HoraMellody Drown_____________ Notas: ___________________________________  Hora: _______________ Notas: ___________________________________  Hora: _______________ Notas: ___________________________________  Hora: _______________ Notas: ___________________________________  Hora: _______________ Notas: ___________________________________  Qu dSander Nephewo saber acerca del nivel bajo de azcar en la sangre? Un nivel bajo de azcar en la sangre se denomina hipoglucemia. Este cuadro ocurre cuando el  nivel de azcar en la sangre es igual o menor que 70mg71m(3,9mmol51m. Entre los sntomas, se pueden incluir los siguientes:  Sentir: ? HambreMount Enterpriseeocupacin o nervios (ansiedad). ? Sudoracin y piel hIntel Corporationnfusin. ? Mareos. ? Somnolencia. ? Ganas de vomitar (nuseas).  Tener: ? Latidos cardacos acelerados. ? Dolor de cabezaNetherlandsmbios en la visin. ? Hormigueo y falta de sensibilidad (entumecimiento) alrededor de la boca, los labios o la lenguaHawkinsvillevimientos espasmdicos que no puede controlar (convulsiones).  Dificultades para hacer lo siguiente: ? Moverse (coordinacin). ? Dormir. ? Desmayos. ? Molestarse con facilidad (irritabilidad). Tratamiento del nivelCactus  bajo de azcar en la sangre Para tratar un nivel bajo de azcar en la sangre, ingiera un alimento o una bebida azucarada de inmediato. Si puede pensar con claridad y tragar de manera segura, siga la regla 15/15, que consiste en lo siguiente:  Consuma 15gramos de un hidrato de carbono de accin rpida (carbohidrato). Hable con su mdico acerca de cunto debera consumir.  Algunos hidratos de carbono de accin rpida son: ? Comprimidos de azcar (pastillas de glucosa). Consuma 3o 4pastillas de glucosa. ? De 6 a 8unidades de caramelos duros. ? De 4 a 6onzas (de 120 a 145m) de jugo de frutas. ? De 4 a 6onzas (de 120 a 1573m de refresco comn (no diettico). ? 1 cucharada (1552mde miel o azcar.  Contrlese el nivel de azcar en la sangre 61m41mos despus de ingerir el hidrato de carbono.  Si el nivel de azcar en la sangre todava es igual o menor que 70mg16m(3,9mmol52m, ingiera nuevamente 15gramos de un hidrato de carbono.  Si el nivel de azcar en la sangre no supera los 70mg/d105m,9mmol/l89mespus de 3intentos, solicite ayuda de inmediato.  Ingiera una comida o una colacin en el transcurso de 1hora despus de que el nivel de azcar en la sangre se haya normalizado. Tratamiento del nivel  muy bajo de azcar en la sangre Si el nivel de azcar en la sangre es igual o menor que 54mg/dl 27mol/l),47mgnifica que est muy bajo (hipoglucemia grave). Esto es una emergeEngineer, maintenance (IT)e a ver si los sntomas desaparecen. Solicite atencin mdica de inmediato. Comunquese con el servicio de emergencias de su localidad (911 en los [place]). No conduzca por sus propios medios hasta el hGoldman Sachs Preguntas para hacerle al mdico  Es necesario que me rena con un instrucRadio broadcast assistantdado de la diabetes?  Qu equipos necesitar para cuidarme en casa?  Qu medicamentos para la diabetes necesito? Cundo debo tomarlos?  Con qu frecuencia debo controlar mi nivel de azcar en la sangre?  A qu nmero puedo llamar si tengo preguntas?  Cundo es mi prxima cita con el mdico?  Dnde puedo encontrar un grupo de apoyo para las personas con diabetes? Dnde buscar ms informacin  American Diabetes Association (Asociacin Estadounidense de la Diabetes): www.diabetes.org  American Association of Diabetes Educators (Asociacin Estadounidense de Instructores para el Cuidado de la Diabetes): www.diabeteseducator.org/patient-resources Comunquese con un mdico si:  El nivel de azcar en la sangre es igual o mayor que 240mg/dl (61mmol/dl) d63mnte 2das seguidos.  Ha estado enfermo o ha tenido fiebre durante 2das o ms y no mejora.  Tiene alguno de estos problemas durante ms de 6horas: ? No puede comer ni beber. ? Siente malestar estomacal (nuseas). ? Vomita. ? Presenta heces lquidas (diarrea). Solicite ayuda inmediatamente si:  El nivel de azcar en la sangre est por debajo de 54mg/dl (3mm65m).  Se53mente confundida.  Tiene dificultad para hacer lo siguiente: ? Pensar con claridad. ? La respiracin. Resumen  La diabetes (diabetes mellitus) es una enfermedad de larga duracin (crnica). Se produce cuando el cuerpo no utiliza correctamente Occupational hygieniste se libera de los alimentos despus de la digestin.  Aplquese la insulina y tome los medicamentos para la diabetes como se lo hayan indicado.  Contrlese el nivel de azcar en la sangre todos los das, con la frDow Cityencia que le hayan indicado.  Concurra a todas las visitas de seguimiento como se lo haya indicado el mdico. Esto es importante. Esta informacin no tiene como fin reempMarine scientist  el consejo del mdico. Asegrese de hacerle al mdico cualquier pregunta que tenga. Document Revised: 05/01/2018 Document Reviewed: 07/13/2017 Elsevier Patient Education  [place] de carbohidratos para la diabetes mellitus en los adultos Carbohydrate Counting for Diabetes Mellitus, Adult  El recuento de carbohidratos es un mtodo para llevar un registro de la cantidad de carbohidratos que se ingieren. La ingesta natural de carbohidratos aumenta la cantidad de azcar (glucosa) en la sangre. El recuento de la cantidad de carbohidratos que se ingieren sirve para que el nivel de glucosa en sangre permanezca dentro de los lmites [place], lo que ayuda a Theatre manager la diabetes (diabetes mellitus) bajo control. Es importante saber la cantidad de carbohidratos que se pueden ingerir en cada comida sin correr Engineer, manufacturing. Esto es Psychologist, forensic. Un especialista en alimentacin y nutricin (nutricionista certificado) puede ayudarlo a crear un plan de alimentacin y a calcular la cantidad de carbohidratos que debe ingerir en cada comida y colacin. Los siguientes alimentos incluyen carbohidratos:  Granos, como panes y cereales.  Frijoles secos y productos con soja.  Verduras con almidn, como papas, guisantes y maz.  Lambert Mody y jugos de frutas.  Leche y Estate agent.  Dulces y colaciones, como pasteles, galletas, caramelos, papas fritas de bolsa y refrescos. Cmo se calculan los carbohidratos? Hay dos maneras de calcular los carbohidratos de los alimentos. Puede usar cualquiera de los  dos mtodos o [place] combinacin de [place]. Leer la etiqueta de "informacin nutricional" de los alimentos envasados La lista de "informacin nutricional" est incluida en las etiquetas de casi todas las bebidas y los alimentos envasados de los [place]. Incluye lo siguiente:  El tamao de la porcin.  Informacin sobre los nutrientes de cada porcin, incluidos los gramos (g) de carbohidratos por porcin. Para usar la "informacin nutricional":  Decida cuntas porciones va a comer.  Multiplique la cantidad de porciones por el nmero de carbohidratos por porcin.  El resultado es la cantidad total de carbohidratos que comer. Conocer los tamaos de las porciones estndar de otros alimentos Cuando coma alimentos que contengan carbohidratos y que no estn envasados o no incluyan la "informacin nutricional" en la etiqueta, debe medir las porciones para poder calcular la cantidad de carbohidratos:  Mida los alimentos que comer con una balanza de alimentos o una taza medidora, si es necesario.  Decida cuntas porciones de International aid/development worker.  Multiplique el nmero de porciones por15. La mayora de los alimentos con alto contenido de carbohidratos contienen unos 15g de carbohidratos por porcin. ? Por ejemplo, si come 8onzas (170g) de fresas, habr comido 2porciones y 30g de carbohidratos (2porciones x 15g=30g).  En el caso de las comidas que contienen mezclas de ms de un alimento, como las sopas y los guisos, debe calcular los carbohidratos de cada alimento que se incluye. La siguiente lista contiene los tamaos de porciones estndar de los alimentos ricos en carbohidratos ms comunes. Cada una de estas porciones tiene aproximadamente 15g de carbohidratos:  pan de hamburguesa o muffin ingls.  onza (37ml) de jarabe.   onza (14g) de mermelada.  1rebanada de pan.  1tortilla de seis pulgadas.  3onzas (85g) de arroz o pasta cocidos.  4onzas (113g) de  frijoles secos cocidos.  4onzas (113g) de verduras con almidn, como guisantes, maz o papas.  4onzas (113g) de cereal caliente.  4 onzas (113g) de pur de papas o de una papa grande al horno.  4onzas (113g) de frutas en lata o congeladas.  4onzas (132ml) de jugo de  frutas.  4a 6galletas.  6croquetas de pollo.  6onzas (170g) de cereales secos sin azcar.  6onzas (170g) de yogur descremado sin ningn agregado o de yogur endulzado con edulcorante artificial.  8onzas (279ml) de [place].  8 onzas (170g) de frutas frescas o una fruta pequea.  24 onzas (680g) de palomitas de maz. Ejemplo de recuento de carbohidratos Ejemplo de comida  3 onzas (85g) de pechugas de pollo.  6onzas (170g) de arroz integral.  4onzas (113g) de maz.  8onzas (259ml) de leche.  8onzas (170g) de fresas con crema batida sin azcar. Clculo de carbohidratos 1. Identifique los alimentos que contienen carbohidratos: ? Arroz. ? Maz. ? Leche. ? Hughie Closs. 2. Calcule cuntas porciones come de cada alimento: ? 2 porciones de arroz. ? 1 porcin de maz. ? 1 porcin de leche. ? 1 porcin de fresas. 3. Multiplique cada nmero de porciones por 15g: ? 2 porciones de arroz x 15 g = 30 g. ? 1 porcin de maz x 15 g = 15 g. ? 1 porcin de leche x 15 g = 15 g. ? 1 porcin de fresas x 15 g = 15 g. 4. Sume todas las cantidades para conocer el total de gramos de carbohidratos consumidos: ? 30g + 15g + 15g + 15g = 75g de carbohidratos en total. Resumen  El recuento de carbohidratos es un mtodo para llevar un registro de la cantidad de carbohidratos que se ingieren.  La ingesta natural de carbohidratos aumenta la cantidad de azcar (glucosa) en la sangre.  El recuento de la cantidad de carbohidratos que se ingieren sirve para Advertising account executive de glucosa en sangre dentro de los lmites [place], lo que ayuda a Theatre manager la diabetes bajo control.  Un especialista en  alimentacin y nutricin (nutricionista certificado) puede ayudarlo a crear un plan de alimentacin y a calcular la cantidad de carbohidratos que debe ingerir en cada comida y colacin. Esta informacin no tiene Marine scientist el consejo del mdico. Asegrese de hacerle al mdico cualquier pregunta que tenga. Document Revised: 12/26/2016 Document Reviewed: 08/18/2015 Elsevier Patient Education  [place].

## 2020-02-05 LAB — BASIC METABOLIC PANEL WITH GFR
BUN/Creatinine Ratio: 17 (ref 9–23)
BUN: 15 mg/dL (ref 6–20)
CO2: 22 mmol/L (ref 20–29)
Calcium: 11 mg/dL — ABNORMAL HIGH (ref 8.7–10.2)
Chloride: 101 mmol/L (ref 96–106)
Creatinine, Ser: 0.86 mg/dL (ref 0.57–1.00)
GFR calc Af Amer: 101 mL/min/1.73
GFR calc non Af Amer: 88 mL/min/1.73
Glucose: 108 mg/dL — ABNORMAL HIGH (ref 65–99)
Potassium: 4.5 mmol/L (ref 3.5–5.2)
Sodium: 137 mmol/L (ref 134–144)

## 2020-02-09 ENCOUNTER — Ambulatory Visit: Payer: Self-pay

## 2020-02-09 ENCOUNTER — Telehealth: Payer: Self-pay | Admitting: Family Medicine

## 2020-02-09 ENCOUNTER — Other Ambulatory Visit: Payer: Self-pay

## 2020-02-09 NOTE — Telephone Encounter (Signed)
Spoke w/pt using Allison Monroe PJ#[id number] advising of MRI scheduled for Friday 02/13/20@4 :30 adv pt NPO 4hrs prior to appt

## 2020-02-09 NOTE — Telephone Encounter (Signed)
PT was inquiring about an MRI for her kidney, mentioned in her last appt. Please advise and thank you

## 2020-02-13 ENCOUNTER — Other Ambulatory Visit: Payer: Self-pay

## 2020-02-13 ENCOUNTER — Ambulatory Visit (HOSPITAL_COMMUNITY)
Admission: RE | Admit: 2020-02-13 | Discharge: 2020-02-13 | Disposition: A | Discharge status: Home / Self Care | Payer: Self-pay | Source: Ambulatory Visit | Attending: Family Medicine | Admitting: Family Medicine

## 2020-02-13 DIAGNOSIS — R1900 Intra-abdominal and pelvic swelling, mass and lump, unspecified site: Secondary | ICD-10-CM | POA: Insufficient documentation

## 2020-02-13 MED ORDER — GADOBUTROL 1 MMOL/ML IV SOLN
9.0000 mL | Freq: Once | INTRAVENOUS | Status: AC | PRN
  Administered 2020-02-13: 9 mL via INTRAVENOUS

## 2020-02-20 ENCOUNTER — Telehealth: Payer: Self-pay | Admitting: Critical Care Medicine

## 2020-02-20 ENCOUNTER — Other Ambulatory Visit: Payer: Self-pay

## 2020-02-20 ENCOUNTER — Ambulatory Visit: Payer: Self-pay | Attending: Critical Care Medicine | Admitting: Pharmacist

## 2020-02-20 DIAGNOSIS — E119 Type 2 diabetes mellitus without complications: Secondary | ICD-10-CM

## 2020-02-20 NOTE — Patient Instructions (Signed)
Gracias por venir a verme hoy. Por favor haga lo siguiente:   1. Contine con la [employer] al da. 2. Contine controlando los niveles de Higher education careers adviser. 3. Los azcares de la maana deben estar entre 80 y 130. 4. Los azcares ms tarde en el da, despus de comer, deben ser menos de 180. 5. Contine haciendo los cambios de estilo de vida que discutimos juntos durante nuestra visita. La dieta y el ejercicio juegan un papel importante en la mejora de los niveles de Museum/gallery exhibitions officer. 6. Haz un seguimiento conmigo en 1 mes.   MyPlate Solicitor) del Customer service manager from [employer] es un bosquejo de una alimentacin saludable general, basado en las Pautas alimentarias para estadounidenses de 2010, emitidas por Management consultant. Department of Agriculture, [employer] (Departamento de Agricultura de EE. UU.). Establece pautas referidas a la cantidad de alimentos que deben consumirse de cada grupo, segn la edad, el sexo y el nivel de [place] fsica. Consejos para seguir las pautas de MiPlato Para seguir las recomendaciones de MiPlato:  Consuma una amplia variedad de frutas y verduras, cereales y alimentos proteicos.  Srvase porciones ms pequeas y coma menos Agricultural consultant.  Limite el tamao de las porciones para evitar comer de ms.  Disfrute la comida.  Haga actividad fsica durante por lo menos 15minutos por semana. Esto significa unos 40minutos por da, 5o ms [employer]. Puede ser difcil que todas las comidas sean como las que se indican en MiPlato. Considere que MiPlato son pautas alimentarias para todo Games developer, Engineer, technical sales de comidas individuales. Lambert Mody y verduras  La mitad del plato debe tener frutas y verduras.  Consuma frutas y verduras de muchos colores [employer].  Para un plan de alimentacin de 2000caloras diarias, coma lo siguiente: ? 2tazas de verduras [employer]. ? 2tazas de [employer].  1taza equivale a: ? 1taza de  verduras crudas o cocidas. ? 1taza de frutas crudas. ? 1naranja, manzana o banana de tamao mediano. ? 1taza de jugo puro de frutas o verduras. ? 2tazas de hojas verdes crudas, por ejemplo, lechuga, espinaca o col rizada. ? taza de frutas disecadas. Cereales  Un cuarto del plato debe consistir en cereales.  Al menos la mitad de los cereales que ingiera al da deben ser cereales integrales.  Para un plan de alimentacin de 2000caloras diarias, coma 6onzas (170gramos) de cereales [employer].  Unaonza (28gramos) equivale a: ? 1rebanada de pan. ? 1taza de cereales. ? taza de arroz cocido, cereales o pastas. Protenas  Un cuarto del plato debe consistir en protenas.  Consuma una amplia variedad de alimentos proteicos, por ejemplo, carne roja, pollo, pescado, huevos, habas, frutos secos y tofu.  Para un plan de alimentacin de 2000caloras diarias, coma 5onzas (160gramos) de [employer].  Unaonza (28gramos) equivale a: ? 1onza (30gramos) de carne de res, ave o pescado. ? de taza de porotos cocidos. ? 1huevo. ? onza (14gramos) de frutos secos o semillas. ? 1cucharada de Laqueta Jean de man. Lcteos  [employer] o con bajo contenido graso (1%).  Consuma lcteos para acompaar las comidas.  Para un plan de alimentacin de 2000caloras por da, consuma 3tazas de lcteos diariamente.  1taza equivale a: ? 1taza de leche, yogur, requesn o [place] de soja (bebida a base de soja). ? 2onzas (56gramos) de queso procesado. ? 1onza (42gramos) de queso natural. Grasas, aceites, sal y azcares  Solo se recomiendan pequeas  cantidades de aceites.  Evite los alimentos ricos en caloras y con Estate manager/land agent nutricional (caloras vacas), como aquellos con alto contenido de grasas o Dispensing optician.  Opte por alimentos con bajo contenido de sal (sodio). Elija alimentos que contengan menos de 145miligramos(mg) de sodio por  porcin.  Beba agua en lugar de bebidas azucaradas. Beba suficiente agua todos los das para mantener la orina de color amarillo plido. Dnde buscar apoyo  Trabaje junto con el mdico o especialista en nutricin (nutricionista) a fin de Ship broker plan de alimentacin personalizado que sea adecuado para usted.  Descargue una aplicacin mvil para controlar su consumo diario de alimentos. Dnde buscar ms informacin  Visite [email or web address] para obtener ms informacin. Resumen  MiPlato es una pauta general de alimentacin saludable, ofrecida por Programmer, multimedia. Se basa en las Pautas alimentarias para estadounidenses de 2010.  En general, la mitad del plato debe incluir frutas y verduras, un cuarto del plato debe incluir cereales y el otro cuarto, protenas. Esta informacin no tiene Marine scientist el consejo del mdico. Asegrese de hacerle al mdico cualquier pregunta que tenga. Document Revised: 09/12/2018 Document Reviewed: 09/12/2018 Elsevier Patient Education  [place].

## 2020-02-20 NOTE — Telephone Encounter (Signed)
Patient informed that Dr. Joya Gaskins has looked at her results for the MRI.  Informed to that there was benign results for the scan and that a follow up scan should be done in 3-6 mos. Patient was appreciative to call back and aware to f/u 03/08/20.

## 2020-02-20 NOTE — Telephone Encounter (Signed)
PT is requesting a call regarding her results of an MRI done on Friday 02/13/20. Pt has an appt on the 22nd of this month but doesn't want to wait a month to hear back. Please advise and thank you.

## 2020-02-20 NOTE — Progress Notes (Signed)
    S:    PCP: Was Dr. Chapman Fitch   No chief complaint on file.  Patient arrives in good spirits.  Presents for diabetes evaluation, education, and management. Patient was referred and last seen by Primary Care Provider on 02/04/2020. She was dx with T2DM at that visit and started on metformin.   Patient reports diabetes was diagnosed in November of this year.   Family/Social History:  - FHx: DM, HTN, stroke - Tobacco: never smoker  - Alcohol: denies use   Insurance coverage/medication affordability: self pay  Medication adherence reported.   Current diabetes medications include: metformin 500 mg BID  Current hypertension medications include: none  Current hyperlipidemia medications include: none   Patient denies hypoglycemic events.  Patient reported dietary habits: - Has been researching diabetic diets on the internet  - In the process of reducing sugars and carbohydrates  - Is interested in dietary counseling   Patient-reported exercise habits:  - Nnone    Patient denies nocturia (nighttime urination).  Patient denies neuropathy (nerve pain). Patient denies visual changes. Patient reports self foot exams.     O:  Lab Results  Component Value Date   HGBA1C 6.5 (A) 02/04/2020   There were no vitals filed for this visit.  Lipid Panel  No results found for: CHOL, TRIG, HDL, CHOLHDL, VLDL, LDLCALC, LDLDIRECT  Home fasting blood sugars: 102 - 144. Since starting 500 mg BID, most fasting sugars have been <130.  2 hour post-meal/random blood sugars: 130 - 168.  Clinical Atherosclerotic Cardiovascular Disease (ASCVD): No  The ASCVD Risk score Mikey Bussing DC Jr., et al., 2013) failed to calculate for the following reasons:   The 2013 ASCVD risk score is only valid for ages 46 to 55   A/P: Diabetes newly dx currently controlled. Patient is able to verbalize appropriate hypoglycemia management plan. Medication adherence optimal. -Continued current regimen.  -Extensively  discussed pathophysiology of diabetes, recommended lifestyle interventions, dietary effects on blood sugar control -Counseled on s/sx of and management of hypoglycemia -Next A1C anticipated 04/2020.  -Urine microalbumin:SCr ratio   ASCVD risk - primary prevention in patient with diabetes. Needs lipid panel. ASCVD risk score cannot be calculated d/t age.  -Lipid  HM: recommend tetanus shot at PCP follow-up.    Written patient instructions provided.  Total time in face to face counseling 30 minutes.   Follow up PCP Clinic Visit 03/08/2020.     Benard Halsted, PharmD, [place], [place] [phone]

## 2020-02-21 LAB — LIPID PANEL
Chol/HDL Ratio: 4.5 ratio — ABNORMAL HIGH (ref 0.0–4.4)
Cholesterol, Total: 197 mg/dL (ref 100–199)
HDL: 44 mg/dL (ref >39)
LDL Chol Calc (NIH): 137 mg/dL — ABNORMAL HIGH (ref 0–99)
Triglycerides: 86 mg/dL (ref 0–149)
VLDL Cholesterol Cal: 16 mg/dL (ref 5–40)

## 2020-02-21 LAB — MICROALBUMIN / CREATININE URINE RATIO
Creatinine, Urine: 120.3 mg/dL
Microalb/Creat Ratio: 143 mg/g creat — ABNORMAL HIGH (ref 0–29)
Microalbumin, Urine: 172.1 ug/mL

## 2020-02-22 ENCOUNTER — Other Ambulatory Visit: Payer: Self-pay | Admitting: Critical Care Medicine

## 2020-02-22 DIAGNOSIS — E1169 Type 2 diabetes mellitus with other specified complication: Secondary | ICD-10-CM

## 2020-02-22 DIAGNOSIS — E785 Hyperlipidemia, unspecified: Secondary | ICD-10-CM

## 2020-02-22 MED ORDER — ATORVASTATIN CALCIUM 10 MG PO TABS
20.0000 mg | ORAL_TABLET | Freq: Every day | ORAL | 3 refills | Status: DC
Start: 2020-02-22 — End: 2020-03-08

## 2020-02-23 MED FILL — ATORVASTATIN 10 MG TABLET: 10 | 30 days supply | Qty: 60 | Fill #0

## 2020-03-07 NOTE — Progress Notes (Addendum)
Subjective:    Patient ID: Allison Monroe, female    DOB: [birth year], 34 y.o.   MRN: [id number] Virtual Visit via Telephone Note  I connected with Allison Monroe on 03/08/20 at  3:30 PM EST by telephone and verified that I am speaking with the correct person using two identifiers.   Consent:  I discussed the limitations, risks, security and privacy concerns of performing an evaluation and management service by telephone and the availability of in person appointments. I also discussed with the patient that there may be a patient responsible charge related to this service. The patient expressed understanding and agreed to proceed.  Location of patient: Patient at home  Location of provider: I am in my office  Persons participating in the televisit with the patient.    Anderson Malta from [employer] interpreters on the phone is a Spanish interpreter no one else on the call   History of Present Illness: 34 y.o.F former fulp PCP. Here to tfr care  Hx T2DM   Only saw Dr Chapman Fitch 1X 02/04/20: This visit was assisted with Anderson Malta from [place] interpretation from [facility] interpreters This patient is seen today by way of a telephone visit to transition care.  She was last seen November 17 of this year by Dr. Chapman Fitch.  At that time for the diabetes the patient was started on Metformin with a gradual titration up.  A1c at that visit was 6.5.  Patient states since that time her blood sugars have been anywhere from 113-117 in the morning fasting and at night 2 hours after dinner 109-123.  She has successfully changed her diet she may have lost some weight she is unsure of that.  Another issue is that she has had abdominal pain with an umbilical hernia and went to the emergency room early November with a CT scan showing the abdominal umbilical hernia present with minimal fatty tissue.  Subsequent MRI of the abdomen showed a small adenoma of the left adrenal gland benign in nature.  Recommendations for  52-month follow-up given for MRI.  The note from November states the patient was referred to general surgery but she never went due to finances.  She states her abdominal pain is slightly better but occasionally is consistent with gas and flatulence.  And pushing on the abdomen itself with her fat of her abdomen.  Another issue is that she has glaucoma and is run out of her eyedrops is now having headaches and change in vision.  She does not have an eye doctor as of yet as she does not have insurance.    Past Medical History:  Diagnosis Date  . No pertinent past medical history      Family History  Problem Relation Age of Onset  . Hypertension Mother   . Diabetes Father      Social History   Socioeconomic History  . Marital status: Single    Spouse name: Not on file  . Number of children: Not on file  . Years of education: Not on file  . Highest education level: Not on file  Occupational History  . Not on file  Tobacco Use  . Smoking status: Never Smoker  . Smokeless tobacco: Never Used  Vaping Use  . Vaping Use: Never used  Substance and Sexual Activity  . Alcohol use: Not Currently  . Drug use: Never  . Sexual activity: Yes  Other Topics Concern  . Not on file  Social History Narrative  . Not on file  Social Determinants of Health   Financial Resource Strain: Not on file  Food Insecurity: Not on file  Transportation Needs: Not on file  Physical Activity: Not on file  Stress: Not on file  Social Connections: Not on file  Intimate Partner Violence: Not on file     No Known Allergies   Outpatient Medications Prior to Visit  Medication Sig Dispense Refill  . Blood Glucose Monitoring Suppl (TRUE METRIX METER) w/Device KIT Use as directed to check blood sugars 1 kit 0  . glucose blood (TRUE METRIX BLOOD GLUCOSE TEST) test strip Use as instructed to initially check blood sugars twice per day- fasting in the morning and before bedtime 100 each 12  . ibuprofen  (ADVIL,MOTRIN) 200 MG tablet Take 200-400 mg by mouth every 6 (six) hours as needed (for pain or headaches).     . medroxyPROGESTERone (DEPO-PROVERA) 150 MG/ML injection Inject 150 mg into the muscle every 3 (three) months.     . TRUEplus Lancets 28G MISC Use to check blood sugars 100 each 11  . atorvastatin (LIPITOR) 10 MG tablet Take 2 tablets (20 mg total) by mouth daily. 60 tablet 3  . metFORMIN (GLUCOPHAGE) 500 MG tablet One pill after the evening meal for one week then increase to twice per day (Patient taking differently: Take 500 mg by mouth 2 (two) times daily with a meal.) 60 tablet 3  . fluorometholone (FML) 0.1 % ophthalmic suspension Place 1 drop into both eyes daily. (Patient not taking: Reported on 03/08/2020)    . naproxen (NAPROSYN) 500 MG tablet Take 1 tablet (500 mg total) by mouth 2 (two) times daily. (Patient not taking: Reported on 03/08/2020) 30 tablet 0  . timolol (BETIMOL) 0.5 % ophthalmic solution Place 1 drop into both eyes 2 (two) times daily. (Patient not taking: Reported on 03/08/2020)    . tiZANidine (ZANAFLEX) 4 MG tablet Take 1 tablet (4 mg total) by mouth every 6 (six) hours as needed for muscle spasms. (Patient not taking: Reported on 03/08/2020) 30 tablet 0   No facility-administered medications prior to visit.      Review of Systems  Constitutional: Negative.   HENT: Negative.   Eyes: Positive for visual disturbance.  Respiratory: Negative.   Cardiovascular: Negative.   Gastrointestinal: Positive for abdominal pain.  Genitourinary: Negative.   Musculoskeletal: Negative.   Skin: Negative.   Neurological: Positive for headaches.  Psychiatric/Behavioral: Negative.        Objective:   Physical Exam There were no vitals filed for this visit.  This is a phone visit there is no exam      Assessment & Plan:  I personally reviewed all images and lab data in the [facility] system as well as any outside material available during this office visit and agree  with the  radiology impressions.   New onset type 2 diabetes mellitus ([place]) New onset diabetes type 2 last hemoglobin A1c 6.5 also slight elevation microalbumin at the last visit  Patient is now on the Metformin we will continue same at 500 mg twice daily we will follow this patient up in the office in January  Glaucoma of both eyes Concerned that the headaches are due to the glaucoma  For this patient will refill her atenolol and steroid eyedrops We will have this patient see ophthalmology Dr. Katy Apo is agreed to see our patient populations for glaucoma evaluation and treatments  Hyperlipidemia due to type 2 diabetes mellitus ([place]) Hyperlipidemia due to diabetes will continue cholesterol therapy  follow-up lipid panel  Umbilical hernia No evidence of obstruction or gangrene with recent umbilical hernia  The patient does not need to see surgery  Weight loss plan given to the patient  Adrenal adenoma, left Benign left adrenal adenoma we will follow-up with MRI in 3 months   Diagnoses and all orders for this visit:  Glaucoma of both eyes, unspecified glaucoma type -     Ambulatory referral to Ophthalmology  New onset type 2 diabetes mellitus ([place]) -     metFORMIN (GLUCOPHAGE) 500 MG tablet; Take 1 tablet (500 mg total) by mouth 2 (two) times daily with a meal.  Hyperlipidemia due to type 2 diabetes mellitus ([place])  Umbilical hernia without obstruction and without gangrene  Adrenal adenoma, left  Other orders -     atorvastatin (LIPITOR) 20 MG tablet; Take 1 tablet (20 mg total) by mouth daily. -     fluorometholone (FML) 0.1 % ophthalmic suspension; Place 1 drop into both eyes daily. -     timolol (BETIMOL) 0.5 % ophthalmic solution; Place 1 drop into both eyes 2 (two) times daily.      Follow Up Instructions: Patient knows an in office exam will be scheduled in January I have given her instructions on her diet on her my chart we will get an appointment with  ophthalmology as well, also eyedrops will be refilled   I discussed the assessment and treatment plan with the patient. The patient was provided an opportunity to ask questions and all were answered. The patient agreed with the plan and demonstrated an understanding of the instructions.   The patient was advised to call back or seek an in-person evaluation if the symptoms worsen or if the condition fails to improve as anticipated.  I provided 30 minutes of non-face-to-face time during this encounter  including  median intraservice time , review of notes, labs, imaging, medications  and explaining diagnosis and management to the patient .    Asencion Noble, MD

## 2020-03-08 ENCOUNTER — Encounter: Payer: Self-pay | Admitting: Critical Care Medicine

## 2020-03-08 ENCOUNTER — Other Ambulatory Visit: Payer: Self-pay | Admitting: Critical Care Medicine

## 2020-03-08 ENCOUNTER — Other Ambulatory Visit: Payer: Self-pay

## 2020-03-08 ENCOUNTER — Ambulatory Visit: Payer: Self-pay | Attending: Critical Care Medicine | Admitting: Critical Care Medicine

## 2020-03-08 ENCOUNTER — Ambulatory Visit: Payer: Self-pay | Admitting: Critical Care Medicine

## 2020-03-08 DIAGNOSIS — D3501 Benign neoplasm of right adrenal gland: Secondary | ICD-10-CM | POA: Insufficient documentation

## 2020-03-08 DIAGNOSIS — H409 Unspecified glaucoma: Secondary | ICD-10-CM

## 2020-03-08 DIAGNOSIS — E785 Hyperlipidemia, unspecified: Secondary | ICD-10-CM

## 2020-03-08 DIAGNOSIS — K429 Umbilical hernia without obstruction or gangrene: Secondary | ICD-10-CM | POA: Insufficient documentation

## 2020-03-08 DIAGNOSIS — E119 Type 2 diabetes mellitus without complications: Secondary | ICD-10-CM | POA: Insufficient documentation

## 2020-03-08 DIAGNOSIS — E1169 Type 2 diabetes mellitus with other specified complication: Secondary | ICD-10-CM

## 2020-03-08 DIAGNOSIS — D3502 Benign neoplasm of left adrenal gland: Secondary | ICD-10-CM

## 2020-03-08 MED ORDER — FLUOROMETHOLONE 0.1 % OP SUSP
1.0000 [drp] | Freq: Every day | OPHTHALMIC | 2 refills | Status: DC
Start: 2020-03-08 — End: 2020-05-31

## 2020-03-08 MED ORDER — TIMOLOL HEMIHYDRATE 0.5 % OP SOLN
1.0000 [drp] | Freq: Two times a day (BID) | OPHTHALMIC | 3 refills | Status: DC
Start: 2020-03-08 — End: 2020-05-31

## 2020-03-08 MED ORDER — ATORVASTATIN CALCIUM 20 MG PO TABS: 20.0000 mg | ORAL_TABLET | Freq: Every day | ORAL | 2 refills | Status: DC

## 2020-03-08 MED ORDER — METFORMIN HCL 500 MG PO TABS: 500.0000 mg | ORAL_TABLET | Freq: Two times a day (BID) | ORAL | 2 refills | Status: DC

## 2020-03-08 MED FILL — METFORMIN HCL 500 MG TABS: 500 | 30 days supply | Qty: 60 | Fill #0

## 2020-03-08 MED FILL — ATORVASTATIN CALCIUM 20 MG: 20 | 30 days supply | Qty: 30 | Fill #0

## 2020-03-08 MED FILL — BETIMOL 0.5 % SOLN: 0.5 | 18 days supply | Qty: 5 | Fill #0

## 2020-03-08 NOTE — Assessment & Plan Note (Signed)
Hyperlipidemia due to diabetes will continue cholesterol therapy follow-up lipid panel

## 2020-03-08 NOTE — Assessment & Plan Note (Signed)
Benign left adrenal adenoma we will follow-up with MRI in 3 months

## 2020-03-08 NOTE — Patient Instructions (Signed)
Follow healthy diet as outlined below  All your medications sent to our pharmacy including your eyedrops  Eye doctor appointment will be made  You will see Dr. Joya Gaskins in January   Diabetes mellitus y nutricin, en adultos Diabetes Mellitus and Nutrition, Adult Si sufre de diabetes (diabetes mellitus), es muy importante tener hbitos alimenticios saludables debido a que sus niveles de Designer, television/film set sangre (glucosa) se ven afectados en gran medida por lo que come y bebe. [place] alimentos saludables en las cantidades [place], aproximadamente a la [employer], [place] ayudar a:  Aeronautical engineer glucemia.  Disminuir el riesgo de sufrir una enfermedad cardaca.  Mejorar la presin arterial.  Science writer o mantener un peso saludable. Todas las personas que sufren de diabetes son diferentes y cada una tiene necesidades diferentes en cuanto a un plan de alimentacin. El mdico puede recomendarle que trabaje con un especialista en dietas y nutricin (nutricionista) para Financial trader plan para usted. Su plan de alimentacin puede variar segn factores como:  Las caloras que necesita.  Los medicamentos que toma.  Su peso.  Sus niveles de glucemia, presin arterial y colesterol.  Su nivel de [place].  Otras afecciones que tenga, como enfermedades cardacas o renales. Cmo me afectan los carbohidratos? Los carbohidratos, o hidratos de carbono, afectan su nivel de glucemia ms que cualquier otro tipo de alimento. La ingesta de carbohidratos naturalmente aumenta la cantidad de [employer]. El recuento de carbohidratos es un mtodo destinado a Catering manager un registro de la cantidad de carbohidratos que se consumen. El recuento de carbohidratos es importante para Theatre manager la glucemia a un nivel saludable, especialmente si utiliza insulina o toma determinados medicamentos por va oral para la diabetes. Es importante conocer la cantidad de carbohidratos que se pueden ingerir en cada  comida sin correr Engineer, manufacturing. Esto es Psychologist, forensic. Su nutricionista puede ayudarlo a calcular la cantidad de carbohidratos que debe ingerir en cada comida y en cada refrigerio. Entre los alimentos que contienen carbohidratos, se incluyen:  Pan, cereal, arroz, pastas y galletas.  Papas y maz.  Guisantes, frijoles y lentejas.  Leche y Estate agent.  Lambert Mody y [place].  Postres, como pasteles, galletas, helado y caramelos. Cmo me afecta el alcohol? El alcohol puede provocar disminuciones sbitas de la glucemia (hipoglucemia), especialmente si utiliza insulina o toma determinados medicamentos por va oral para la diabetes. La hipoglucemia es una afeccin potencialmente mortal. Los sntomas de la hipoglucemia (somnolencia, mareos y confusin) son similares a los sntomas de haber consumido demasiado alcohol. Si el mdico afirma que el alcohol es seguro para usted, [place] estas pautas:  Limite el consumo de alcohol a no ms de 9medida por da si es mujer y no est [place], y a 69medidas si es hombre. Una medida equivale a 12oz (359ml) de cerveza, 5oz (135ml) de vino o 1oz (5ml) de bebidas alcohlicas de alta graduacin.  No beba con el estmago vaco.  Mantngase hidratado bebiendo agua, refrescos dietticos o t helado sin azcar.  Tenga en cuenta que los refrescos comunes, los jugos y otras bebida para Optician, dispensing pueden contener mucha azcar y se deben contar como carbohidratos. Cules son algunos consejos para seguir este plan?  Leer las etiquetas de los alimentos  Comience por leer el tamao de la porcin en la "Informacin nutricional" en las etiquetas de los alimentos envasados y las bebidas. La cantidad de caloras, carbohidratos, grasas y otros nutrientes mencionados en la etiqueta se basan en una porcin del alimento.  Muchos alimentos contienen ms de una porcin por envase.  Verifique la cantidad total de gramos (g) de carbohidratos totales en una porcin. Puede  calcular la cantidad de porciones de carbohidratos al dividir el total de carbohidratos por 15. Por ejemplo, si un alimento tiene un total de 30g de carbohidratos, equivale a 2 porciones de carbohidratos.  Verifique la cantidad de gramos (g) de grasas saturadas y grasas trans en una porcin. Escoja alimentos que no contengan grasa o que tengan un bajo contenido.  Verifique la cantidad de miligramos (mg) de sal (sodio) en una porcin. La mayora de las personas deben limitar la ingesta de sodio total a menos de 2300mg  por Training and development officer.  Siempre consulte la informacin nutricional de los alimentos etiquetados como "con bajo contenido de grasa" o "sin grasa". Estos alimentos pueden tener un mayor contenido de Location manager agregada o carbohidratos refinados, y deben evitarse.  Hable con su nutricionista para identificar sus objetivos diarios en cuanto a los nutrientes mencionados en la etiqueta. Al ir de compras  Evite comprar alimentos procesados, enlatados o precocinados. Estos alimentos tienden a Special educational needs teacher mayor cantidad de [place], sodio y azcar agregada.  Compre en la zona exterior de la tienda de comestibles. Esta zona incluye frutas y verduras frescas, granos a granel, carnes frescas y productos lcteos frescos. Al cocinar  Utilice mtodos de coccin a baja temperatura, como hornear, en lugar de mtodos de coccin a alta temperatura, como frer en abundante aceite.  Cocine con aceites saludables, como el aceite de [place], [place] o [place].  Evite cocinar con manteca, crema o carnes con alto contenido de grasa. Planificacin de las comidas  Coma las comidas y los refrigerios regularmente, preferentemente a la misma hora todos [place]. Evite pasar largos perodos de tiempo sin comer.  Consuma alimentos ricos en fibra, como frutas frescas, verduras, frijoles y cereales integrales. Consulte a su nutricionista sobre cuntas porciones de carbohidratos puede consumir en cada comida.  Consuma entre 4 y 6  onzas (oz) de protenas magras por da, como carnes [place], pollo, pescado, huevos o tofu. Una onza de protena magra equivale a: ? 1 onza de carne, pollo o pescado. ? 1huevo. ?  taza de tofu.  Coma algunos alimentos por da que contengan grasas saludables, como aguacates, frutos secos, semillas y pescado. Estilo de vida  Controle su nivel de glucemia con regularidad.  Haga actividad fsica habitualmente como se lo haya indicado el mdico. Esto puede incluir lo siguiente: ? 168minutos semanales de ejercicio de intensidad moderada o alta. Esto podra incluir caminatas dinmicas, ciclismo o gimnasia acutica. ? Realizar ejercicios de elongacin y de fortalecimiento, como yoga o levantamiento de pesas, por lo menos 2veces por semana.  [name] los [employer] se lo haya indicado el mdico.  No consuma ningn producto que contenga nicotina o tabaco, como cigarrillos y Psychologist, sport and exercise. Si necesita ayuda para dejar de fumar, consulte al [employer] con un asesor o instructor en diabetes para identificar estrategias para controlar el estrs y cualquier desafo emocional y social. Preguntas para hacerle al mdico  Es necesario que consulte a Radio broadcast assistant en el cuidado de la diabetes?  Es necesario que me rena con un nutricionista?  A qu nmero puedo llamar si tengo preguntas?  Cules son los mejores momentos para controlar la glucemia? Dnde encontrar ms informacin:  Asociacin Estadounidense de la Diabetes (American Diabetes Association): diabetes.org  Academia de Nutricin y Information systems manager (Academy of Nutrition and Dietetics): www.eatright.[place] Diabetes y [place]  Digestivas y [facility] of Diabetes and Digestive and Kidney Diseases, NIH): [employer]eral.dk Resumen  Un plan de alimentacin saludable lo ayudar a controlar la glucemia y Theatre manager un estilo de vida saludable.  Trabajar con un especialista en  dietas y nutricin (nutricionista) puede ayudarlo a Insurance claims handler de alimentacin para usted.  Tenga en cuenta que los carbohidratos (hidratos de carbono) y el alcohol tienen efectos inmediatos en sus niveles de glucemia. Es importante contar los carbohidratos que ingiere y consumir alcohol con prudencia. Esta informacin no tiene Marine scientist el consejo del mdico. Asegrese de hacerle al mdico cualquier pregunta que tenga. Document Revised: 11/14/2016 Document Reviewed: 06/26/2016 Elsevier Patient Education  [place].

## 2020-03-08 NOTE — Assessment & Plan Note (Signed)
New onset diabetes type 2 last hemoglobin A1c 6.5 also slight elevation microalbumin at the last visit  Patient is now on the Metformin we will continue same at 500 mg twice daily we will follow this patient up in the office in January

## 2020-03-08 NOTE — Assessment & Plan Note (Signed)
No evidence of obstruction or gangrene with recent umbilical hernia  The patient does not need to see surgery  Weight loss plan given to the patient

## 2020-03-08 NOTE — Assessment & Plan Note (Signed)
Concerned that the headaches are due to the glaucoma  For this patient will refill her atenolol and steroid eyedrops We will have this patient see ophthalmology Dr. Katy Apo is agreed to see our patient populations for glaucoma evaluation and treatments

## 2020-03-10 MED FILL — TRUEplus LANCETS 28G MISC: 30 days supply | Qty: 100 | Fill #1

## 2020-03-10 MED FILL — FLUOROMETHOLONE 0.1% DROPS: 0.1 | 74 days supply | Qty: 10 | Fill #0

## 2020-03-10 MED FILL — TRUE METRIX TEST STRIP: 30 days supply | Qty: 100 | Fill #1

## 2020-03-15 ENCOUNTER — Ambulatory Visit: Payer: Self-pay | Admitting: Critical Care Medicine

## 2020-03-25 ENCOUNTER — Ambulatory Visit: Payer: Self-pay | Admitting: Family Medicine

## 2020-04-07 MED FILL — ATORVASTATIN CALCIUM 20 MG: 20 | 30 days supply | Qty: 30 | Fill #1

## 2020-04-07 MED FILL — METFORMIN HCL 500 MG TABS: 500 | 30 days supply | Qty: 60 | Fill #1

## 2020-04-09 NOTE — Progress Notes (Signed)
Subjective:    Patient ID: Allison Monroe, female    DOB: [birth year], 35 y.o.   MRN: [id number]  History of Present Illness: 35 y.o.F former fulp PCP. Here to tfr care  03/08/20 Hx T2DM   Only saw Allison Monroe 1X 02/04/20: This visit was assisted with Allison Monroe from [place] interpretation from [facility] interpreters This patient is seen today by way of a telephone visit to transition care.  She was last seen November 17 of this year by Allison. Chapman Monroe.  At that time for the diabetes the patient was started on Metformin with a gradual titration up.  A1c at that visit was 6.5.  Patient states since that time her blood sugars have been anywhere from 113-117 in the morning fasting and at night 2 hours after dinner 109-123.  She has successfully changed her diet she may have lost some weight she is unsure of that.  Another issue is that she has had abdominal pain with an umbilical hernia and went to the emergency room early November with a CT scan showing the abdominal umbilical hernia present with minimal fatty tissue.  Subsequent MRI of the abdomen showed a small adenoma of the left adrenal gland benign in nature.  Recommendations for 67-month follow-up given for MRI.  The note from November states the patient was referred to general surgery but she never went due to finances.  She states her abdominal pain is slightly better but occasionally is consistent with gas and flatulence.  And pushing on the abdomen itself with her fat of her abdomen.  Another issue is that she has glaucoma and is run out of her eyedrops is now having headaches and change in vision.  She does not have an eye doctor as of yet as she does not have insurance.  04/12/2020 [name] assisted in this visit through [employer] Patient is seen in return follow-up with type 2 diabetes.  She brings her glucose log with her blood sugars are in the 100-1 20 range with the highest postprandial at 150.  She has changed her diet  substantially as well.  She maintains Metformin alone.  Patient also has an umbilical hernia which is been less troublesome recently with decreased abdominal pain.  She does get her Pap smears with her gynecologist and just had this last year.  She is still receiving Depo Provera through gynecology.  Patient does have a left adrenal adenoma which is quite small repeat scan will be scheduled for later this spring.  Patient does have severe glaucoma and is on 2 different eyedrops is yet to see ophthalmology they pushed her appointment out to April.  She still has headaches with this.   Past Medical History:  Diagnosis Date  . No pertinent past medical history      Family History  Problem Relation Age of Onset  . Hypertension Mother   . Diabetes Father      Social History   Socioeconomic History  . Marital status: Single    Spouse name: Not on file  . Number of children: Not on file  . Years of education: Not on file  . Highest education level: Not on file  Occupational History  . Not on file  Tobacco Use  . Smoking status: Never Smoker  . Smokeless tobacco: Never Used  Vaping Use  . Vaping Use: Never used  Substance and Sexual Activity  . Alcohol use: Not Currently  . Drug use: Never  . Sexual activity: Yes  Other Topics  Concern  . Not on file  Social History Narrative  . Not on file   Social Determinants of Health   Financial Resource Strain: Not on file  Food Insecurity: Not on file  Transportation Needs: Not on file  Physical Activity: Not on file  Stress: Not on file  Social Connections: Not on file  Intimate Partner Violence: Not on file     No Known Allergies   Outpatient Medications Prior to Visit  Medication Sig Dispense Refill  . atorvastatin (LIPITOR) 20 MG tablet Take 1 tablet (20 mg total) by mouth daily. 90 tablet 2  . Blood Glucose Monitoring Suppl (TRUE METRIX METER) w/Device KIT Use as directed to check blood sugars 1 kit 0  .  fluorometholone (FML) 0.1 % ophthalmic suspension Place 1 drop into both eyes daily. 5 mL 2  . glucose blood (TRUE METRIX BLOOD GLUCOSE TEST) test strip Use as instructed to initially check blood sugars twice per day- fasting in the morning and before bedtime 100 each 12  . ibuprofen (ADVIL,MOTRIN) 200 MG tablet Take 200-400 mg by mouth every 6 (six) hours as needed (for pain or headaches).     . medroxyPROGESTERone (DEPO-PROVERA) 150 MG/ML injection Inject 150 mg into the muscle every 3 (three) months.     . metFORMIN (GLUCOPHAGE) 500 MG tablet Take 1 tablet (500 mg total) by mouth 2 (two) times daily with a meal. 180 tablet 2  . timolol (BETIMOL) 0.5 % ophthalmic solution Place 1 drop into both eyes 2 (two) times daily. 10 mL 3  . TRUEplus Lancets 28G MISC Use to check blood sugars 100 each 11   No facility-administered medications prior to visit.      Review of Systems  Constitutional: Negative.   HENT: Negative.   Eyes: Positive for visual disturbance.  Respiratory: Negative.   Cardiovascular: Negative.   Gastrointestinal: Positive for abdominal pain.  Genitourinary: Negative.   Musculoskeletal: Negative.   Skin: Negative.   Neurological: Positive for headaches.  Psychiatric/Behavioral: Negative.        Objective:   Physical Exam Vitals:   04/12/20 0944  BP: 109/72  Pulse: 66  Temp: 98.6 F (37 C)  TempSrc: Oral  SpO2: 97%  Weight: 187 lb 3.2 oz (84.9 kg)  Height: $Remove'5\' 3"'ojPxrKP$  (1.6 m)   Vitals:   04/12/20 0944  BP: 109/72  Pulse: 66  Temp: 98.6 F (37 C)  TempSrc: Oral  SpO2: 97%  Weight: 187 lb 3.2 oz (84.9 kg)  Height: $Remove'5\' 3"'mkcBwww$  (1.6 m)    Gen: Pleasant, well-nourished, in no distress,  normal affect  ENT: No lesions,  mouth clear,  oropharynx clear, no postnasal drip  Neck: No JVD, no TMG, no carotid bruits  Lungs: No use of accessory muscles, no dullness to percussion, clear without rales or rhonchi  Cardiovascular: RRR, heart sounds normal, no murmur or  gallops, no peripheral edema  Abdomen: soft and NT, no HSM,  BS normal  Musculoskeletal: No deformities, no cyanosis or clubbing  Neuro: alert, non focal  Skin: Warm, no lesions or rashes Foot exam was normal  T   Assessment & Plan:  I personally reviewed all images and lab data in the [facility] system as well as any outside material available during this office visit and agree with the  radiology impressions.   Adrenal adenoma, left Left adrenal adenoma will be reimaged later in the spring  Hyperlipidemia due to type 2 diabetes mellitus (Allison Monroe) Continue Lipitor as prescribed  New onset type  2 diabetes mellitus (Allison Monroe) Type 2 diabetes well controlled continue Metformin  Glaucoma of both eyes Severe glaucoma both eyes despite 2 different eyedrops  I have asked for the ophthalmology appointment to occur sooner than April  Umbilical hernia Observe umbilical hernia for now   Allison Monroe was seen today for follow-up.  Diagnoses and all orders for this visit:  Need for hepatitis C screening test -     HCV Ab w Reflex to Quant PCR  Hyperlipidemia due to type 2 diabetes mellitus (Allison Monroe)  Encounter for screening for HIV -     HIV Antibody (routine testing w rflx)  Glaucoma of both eyes, unspecified glaucoma type -     Ambulatory referral to Ophthalmology  Need for Tdap vaccination -     Tdap vaccine greater than or equal to 7yo IM  Need for pneumococcal vaccination -     Pneumococcal polysaccharide vaccine 23-valent greater than or equal to 2yo subcutaneous/IM  Adrenal adenoma, left  New onset type 2 diabetes mellitus (Allison Monroe)  Umbilical hernia without obstruction and without gangrene    Patient given information where she can obtain a Covid booster  We will check HIV hep C assays today  Patient received Pneumovax 23 valent and Tdap at this visit

## 2020-04-12 ENCOUNTER — Ambulatory Visit: Payer: Self-pay | Attending: Critical Care Medicine | Admitting: Critical Care Medicine

## 2020-04-12 ENCOUNTER — Other Ambulatory Visit: Payer: Self-pay

## 2020-04-12 ENCOUNTER — Encounter: Payer: Self-pay | Admitting: Critical Care Medicine

## 2020-04-12 VITALS — BP 109/72 | HR 66 | Temp 98.6°F | Ht 63.0 in | Wt 187.2 lb

## 2020-04-12 DIAGNOSIS — D3502 Benign neoplasm of left adrenal gland: Secondary | ICD-10-CM

## 2020-04-12 DIAGNOSIS — Z114 Encounter for screening for human immunodeficiency virus [HIV]: Secondary | ICD-10-CM

## 2020-04-12 DIAGNOSIS — Z23 Encounter for immunization: Secondary | ICD-10-CM

## 2020-04-12 DIAGNOSIS — E119 Type 2 diabetes mellitus without complications: Secondary | ICD-10-CM

## 2020-04-12 DIAGNOSIS — E785 Hyperlipidemia, unspecified: Secondary | ICD-10-CM

## 2020-04-12 DIAGNOSIS — H409 Unspecified glaucoma: Secondary | ICD-10-CM

## 2020-04-12 DIAGNOSIS — Z1159 Encounter for screening for other viral diseases: Secondary | ICD-10-CM

## 2020-04-12 DIAGNOSIS — K429 Umbilical hernia without obstruction or gangrene: Secondary | ICD-10-CM

## 2020-04-12 DIAGNOSIS — E1169 Type 2 diabetes mellitus with other specified complication: Secondary | ICD-10-CM

## 2020-04-12 NOTE — Patient Instructions (Addendum)
COVID-19 Vaccine Information can be found at: [email or web address] For questions related to vaccine distribution or appointments, please email [email or web address] or call [phone].    Programaremos su cita de oftalmologa ms pronto  Los laboratorios de [employer] incluyen pruebas de hepatitis C y VIH  [place] contra el neumovax y el ttanos administrada en esta visita  Obtenga su vacuna de refuerzo Covid  No hay cambios en los medicamentos por ahora  Planearemos repetir una resonancia magntica de su abdomen esta primavera para hacer un seguimiento del adenoma que se vio anteriormente en noviembre.   Volver a ver al Dr. Joya Gaskins 3 meses

## 2020-04-12 NOTE — Assessment & Plan Note (Signed)
Left adrenal adenoma will be reimaged later in the spring

## 2020-04-12 NOTE — Progress Notes (Signed)
Pt complains of headaches last week and nausea

## 2020-04-12 NOTE — Assessment & Plan Note (Signed)
Severe glaucoma both eyes despite 2 different eyedrops  I have asked for the ophthalmology appointment to occur sooner than April

## 2020-04-12 NOTE — Assessment & Plan Note (Signed)
Continue Lipitor as prescribed.

## 2020-04-12 NOTE — Assessment & Plan Note (Signed)
Type 2 diabetes well controlled continue Metformin

## 2020-04-12 NOTE — Assessment & Plan Note (Signed)
Observe umbilical hernia for now

## 2020-04-19 ENCOUNTER — Ambulatory Visit (INDEPENDENT_AMBULATORY_CARE_PROVIDER_SITE_OTHER): Payer: Self-pay

## 2020-04-19 DIAGNOSIS — Z23 Encounter for immunization: Secondary | ICD-10-CM

## 2020-04-19 NOTE — Progress Notes (Signed)
   Covid-19 Vaccination Clinic  Name:  Kruti Horacek    MRN: [id number] DOB: [birth year]  04/19/2020  Ms. Perulero-Tornez was observed post Covid-19 immunization for 15 minutes without incident. She was provided with Vaccine Information Sheet and instruction to access the V-Safe system.   Ms. Armond Hang was instructed to call 911 with any severe reactions post vaccine: Marland Kitchen Difficulty breathing  . Swelling of face and throat  . A fast heartbeat  . A bad rash all over body  . Dizziness and weakness   Immunizations Administered    Name Date Dose VIS Date Route   Pfizer COVID-19 Vaccine 04/19/2020  5:05 PM 0.3 mL 01/07/2020 Intramuscular   Manufacturer: [place]   Lot: [id number]   NDC: [address]-0

## 2020-05-06 MED FILL — BETIMOL 0.5 % SOLN: 0.5 | 18 days supply | Qty: 5 | Fill #1

## 2020-05-06 MED FILL — ATORVASTATIN CALCIUM 20 MG: 20 | 30 days supply | Qty: 30 | Fill #2

## 2020-05-06 MED FILL — TRUE METRIX TEST STRIP: 30 days supply | Qty: 100 | Fill #2

## 2020-05-06 MED FILL — TRUEplus LANCETS 28G MISC: 30 days supply | Qty: 100 | Fill #2

## 2020-05-06 MED FILL — METFORMIN HCL 500 MG TABS: 500 | 30 days supply | Qty: 60 | Fill #2

## 2020-05-30 NOTE — Progress Notes (Unsigned)
Subjective:    Patient ID: Allison Monroe, female    DOB: [birth year], 35 y.o.   MRN: [id number] Virtual Visit via Telephone Note  I connected with Allison Monroe on 05/31/20 at  9:30 AM EDT by telephone and verified that I am speaking with the correct person using two identifiers.   Consent:  I discussed the limitations, risks, security and privacy concerns of performing an evaluation and management service by telephone and the availability of in person appointments. I also discussed with the patient that there may be a patient responsible charge related to this service. The patient expressed understanding and agreed to proceed.  Location of patient: Patient's at home  Location of provider: I am in my office at home  Persons participating in the televisit with the patient.   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 Here to tfr care  03/08/20 Hx T2DM   Only saw Dr Chapman Fitch 1X 02/04/20: This visit was assisted with Anderson Malta from [place] interpretation from [facility] interpreters This patient is seen today by way of a telephone visit to transition care.  She was last seen November 17 of this year by Dr. Chapman Fitch.  At that time for the diabetes the patient was started on Metformin with a gradual titration up.  A1c at that visit was 6.5.  Patient states since that time her blood sugars have been anywhere from 113-117 in the morning fasting and at night 2 hours after dinner 109-123.  She has successfully changed her diet she may have lost some weight she is unsure of that.  Another issue is that she has had abdominal pain with an umbilical hernia and went to the emergency room early November with a CT scan showing the abdominal umbilical hernia present with minimal fatty tissue.  Subsequent MRI of the abdomen showed a small adenoma of the left adrenal gland  benign in nature.  Recommendations for 77-monthfollow-up given for MRI.  The note from November states the patient was referred to general surgery but she never went due to finances.  She states her abdominal pain is slightly better but occasionally is consistent with gas and flatulence.  And pushing on the abdomen itself with her fat of her abdomen.  Another issue is that she has glaucoma and is run out of her eyedrops is now having headaches and change in vision.  She does not have an eye doctor as of yet as she does not have insurance.  04/12/2020 Spanish interpreter Aveline assisted in this visit through [employer] Patient is seen in return follow-up with type 2 diabetes.  She brings her glucose log with her blood sugars are in the 100-1 20 range with the highest postprandial at 150.  She has changed her diet substantially as well.  She maintains Metformin alone.  Patient also has an umbilical hernia which is been less troublesome recently with decreased abdominal pain.  She does get her Pap smears with her gynecologist and just had this last year.  She is still receiving Depo Provera through gynecology.  Patient does have a left adrenal adenoma which is quite small repeat scan will be scheduled for later this spring.  Patient does have severe glaucoma and is on 2 different eyedrops is yet to see ophthalmology they pushed her appointment out to April.  She still has headaches with this.  05/31/20 This is a telephone visit  the patient did not have technology for video.  The visit was assisted by Spanish interpreter [employer] interpreters.  Patient is returning in follow-up for diabetes type 2, left adrenal adenoma, hyperlipidemia, glaucoma both eyes.  Patient states since the last visit she has developed tingling and numbness in the right upper arm with some weakness in the hand.  The left shoulder is weak and she states nothing seems to make it better it is made worse if she keeps it in one  position for long periods of time.  The symptoms only began 2 weeks ago.  She did not injure the shoulder.  Another complaint is that of loss of hair on the top part of the scalp.  She denies any hirsutism.  She is on Depo-Provera injections.  Note the patient does have a full orange card and [place] discount.  She is due up an MRI follow-up of the left adrenal adenoma.  She is yet to receive her HIV and hepatitis C screening labs.  Patient will have an appointment with her ophthalmologist in April to follow-up on her glaucoma she does stay on her eyedrops for glaucoma.  She states her blood sugar in the morning is 112 fasting occasionally in the afternoon is 1 40-1 70 after eating.  She is compliant with the Metformin twice daily. Note the patient has received her Covid booster vaccine  Past Medical History:  Diagnosis Date  . Diabetes mellitus without complication ([place])    Phreesia 04/18/2020  . Glaucoma    Phreesia 04/18/2020  . No pertinent past medical history      Family History  Problem Relation Age of Onset  . Hypertension Mother   . Diabetes Father      Social History   Socioeconomic History  . Marital status: Single    Spouse name: Not on file  . Number of children: Not on file  . Years of education: Not on file  . Highest education level: Not on file  Occupational History  . Not on file  Tobacco Use  . Smoking status: Never Smoker  . Smokeless tobacco: Never Used  Vaping Use  . Vaping Use: Never used  Substance and Sexual Activity  . Alcohol use: Not Currently  . Drug use: Never  . Sexual activity: Yes  Other Topics Concern  . Not on file  Social History Narrative  . Not on file   Social Determinants of Health   Financial Resource Strain: Not on file  Food Insecurity: Not on file  Transportation Needs: Not on file  Physical Activity: Not on file  Stress: Not on file  Social Connections: Not on file  Intimate Partner Violence: Not on file     No  Known Allergies   Outpatient Medications Prior to Visit  Medication Sig Dispense Refill  . Blood Glucose Monitoring Suppl (TRUE METRIX METER) w/Device KIT Use as directed to check blood sugars 1 kit 0  . glucose blood (TRUE METRIX BLOOD GLUCOSE TEST) test strip Use as instructed to initially check blood sugars twice per day- fasting in the morning and before bedtime 100 each 12  . ibuprofen (ADVIL,MOTRIN) 200 MG tablet Take 200-400 mg by mouth every 6 (six) hours as needed (for pain or headaches).     . medroxyPROGESTERone (DEPO-PROVERA) 150 MG/ML injection Inject 150 mg into the muscle every 3 (three) months.     . TRUEplus Lancets 28G MISC Use to check blood sugars 100 each 11  . atorvastatin (LIPITOR) 20  MG tablet Take 1 tablet (20 mg total) by mouth daily. 90 tablet 2  . fluorometholone (FML) 0.1 % ophthalmic suspension Place 1 drop into both eyes daily. 5 mL 2  . metFORMIN (GLUCOPHAGE) 500 MG tablet Take 1 tablet (500 mg total) by mouth 2 (two) times daily with a meal. 180 tablet 2  . timolol (BETIMOL) 0.5 % ophthalmic solution Place 1 drop into both eyes 2 (two) times daily. 10 mL 3   No facility-administered medications prior to visit.      Review of Systems  Constitutional: Negative.   HENT: Negative.   Eyes: Positive for visual disturbance.  Respiratory: Negative.   Cardiovascular: Negative.   Gastrointestinal: Negative for abdominal pain.  Genitourinary: Negative.   Musculoskeletal: Negative.   Skin: Negative.   Neurological: Positive for weakness and numbness. Negative for headaches.       R arm weak and numbness  Psychiatric/Behavioral: Negative.        Objective:   Physical Exam There were no vitals filed for this visit. There were no vitals filed for this visit. No exam this is a phone visit T   Assessment & Plan:  I personally reviewed all images and lab data in the [facility] system as well as any outside material available during this office visit and agree with  the  radiology impressions.   Adrenal adenoma, left Will need to have follow-up MRI performed of the left adrenal adenoma this will be scheduled  Hyperlipidemia due to type 2 diabetes mellitus ([place]) Follow-up liver function panel and continue atorvastatin  New onset type 2 diabetes mellitus ([place]) Appears to be improved continue Metformin  Glaucoma of both eyes Patient encouraged to keep her follow-ups with ophthalmology  Numbness and tingling of right arm Difficult to determine cause as this was a phone visit  Given history of hyperlipidemia and diabetes we will asked this patient to see neurology she does have the orange card  Alopecia Unclear etiology will check thyroid function note she is on Depo-Provera Doubt hyper androgens  Bring the patient in for direct exam    Henriette was seen today for follow-up.  Diagnoses and all orders for this visit:  New onset type 2 diabetes mellitus ([place]) -     metFORMIN (GLUCOPHAGE) 500 MG tablet; Take 1 tablet (500 mg total) by mouth 2 (two) times daily with a meal. -     Comprehensive metabolic panel  Adrenal adenoma, left -     MR Abdomen W Wo Contrast; Future  Need for hepatitis C screening test -     HCV Ab w Reflex to Quant PCR; Future  Encounter for screening for HIV -     HIV Antibody (routine testing w rflx); Future  Hyperlipidemia due to type 2 diabetes mellitus ([facility])  Glaucoma of both eyes, unspecified glaucoma type  Numbness and tingling of right arm  Alopecia -     Thyroid Panel With TSH  Other orders -     timolol (BETIMOL) 0.5 % ophthalmic solution; Place 1 drop into both eyes 2 (two) times daily. -     fluorometholone (FML) 0.1 % ophthalmic suspension; Place 1 drop into both eyes daily. -     atorvastatin (LIPITOR) 20 MG tablet; Take 1 tablet (20 mg total) by mouth daily.  Will need to recheck hepatitis C and HIV as it was not obtained at the last office visit   Follow Up Instructions: Patient knows a  follow-up exam will occur within the month and  a referral to neurology be made for the left arm numbness.  Patient knows to come in for lab work to include thyroid functions HIV and hepatitis C assay and an MRI of the abdomen will be obtained to follow-up the adenoma of the adrenal gland on the left   I discussed the assessment and treatment plan with the patient. The patient was provided an opportunity to ask questions and all were answered. The patient agreed with the plan and demonstrated an understanding of the instructions.   The patient was advised to call back or seek an in-person evaluation if the symptoms worsen or if the condition fails to improve as anticipated.  I provided 30 minutes of non-face-to-face time during this encounter  including  median intraservice time , review of notes, labs, imaging, medications  and explaining diagnosis and management to the patient .    Asencion Noble, MD

## 2020-05-31 ENCOUNTER — Other Ambulatory Visit: Payer: Self-pay | Admitting: Critical Care Medicine

## 2020-05-31 ENCOUNTER — Ambulatory Visit: Payer: Self-pay | Attending: Critical Care Medicine | Admitting: Critical Care Medicine

## 2020-05-31 ENCOUNTER — Encounter: Payer: Self-pay | Admitting: Critical Care Medicine

## 2020-05-31 ENCOUNTER — Other Ambulatory Visit: Payer: Self-pay

## 2020-05-31 ENCOUNTER — Encounter: Payer: Self-pay | Admitting: Neurology

## 2020-05-31 DIAGNOSIS — E119 Type 2 diabetes mellitus without complications: Secondary | ICD-10-CM

## 2020-05-31 DIAGNOSIS — E1169 Type 2 diabetes mellitus with other specified complication: Secondary | ICD-10-CM

## 2020-05-31 DIAGNOSIS — R200 Anesthesia of skin: Secondary | ICD-10-CM | POA: Insufficient documentation

## 2020-05-31 DIAGNOSIS — L649 Androgenic alopecia, unspecified: Secondary | ICD-10-CM | POA: Insufficient documentation

## 2020-05-31 DIAGNOSIS — Z114 Encounter for screening for human immunodeficiency virus [HIV]: Secondary | ICD-10-CM

## 2020-05-31 DIAGNOSIS — E785 Hyperlipidemia, unspecified: Secondary | ICD-10-CM

## 2020-05-31 DIAGNOSIS — R202 Paresthesia of skin: Secondary | ICD-10-CM | POA: Insufficient documentation

## 2020-05-31 DIAGNOSIS — D3502 Benign neoplasm of left adrenal gland: Secondary | ICD-10-CM

## 2020-05-31 DIAGNOSIS — H409 Unspecified glaucoma: Secondary | ICD-10-CM

## 2020-05-31 DIAGNOSIS — L659 Nonscarring hair loss, unspecified: Secondary | ICD-10-CM

## 2020-05-31 DIAGNOSIS — Z1159 Encounter for screening for other viral diseases: Secondary | ICD-10-CM

## 2020-05-31 HISTORY — DX: Anesthesia of skin: R20.2

## 2020-05-31 HISTORY — DX: Anesthesia of skin: R20.0

## 2020-05-31 MED ORDER — TIMOLOL HEMIHYDRATE 0.5 % OP SOLN: 1.0000 [drp] | Freq: Two times a day (BID) | OPHTHALMIC | 3 refills | Status: DC

## 2020-05-31 MED ORDER — ATORVASTATIN CALCIUM 20 MG PO TABS: 20.0000 mg | ORAL_TABLET | Freq: Every day | ORAL | 2 refills | Status: DC

## 2020-05-31 MED ORDER — FLUOROMETHOLONE 0.1 % OP SUSP
1.0000 [drp] | Freq: Every day | OPHTHALMIC | 2 refills | Status: DC
Start: 2020-05-31 — End: 2020-05-31

## 2020-05-31 MED ORDER — METFORMIN HCL 500 MG PO TABS: 500.0000 mg | ORAL_TABLET | Freq: Two times a day (BID) | ORAL | 2 refills | Status: DC

## 2020-05-31 MED FILL — ?ATORVASTATIN 20 MG TABLET: 20 | 30 days supply | Qty: 30 | Fill #0

## 2020-05-31 NOTE — Assessment & Plan Note (Signed)
Will need to have follow-up MRI performed of the left adrenal adenoma this will be scheduled

## 2020-05-31 NOTE — Assessment & Plan Note (Signed)
Patient encouraged to keep her follow-ups with ophthalmology

## 2020-05-31 NOTE — Assessment & Plan Note (Signed)
Unclear etiology will check thyroid function note she is on Depo-Provera Doubt hyper androgens  Bring the patient in for direct exam

## 2020-05-31 NOTE — Assessment & Plan Note (Signed)
Difficult to determine cause as this was a phone visit  Given history of hyperlipidemia and diabetes we will asked this patient to see neurology she does have the orange card

## 2020-05-31 NOTE — Assessment & Plan Note (Signed)
Appears to be improved continue Metformin

## 2020-05-31 NOTE — Progress Notes (Signed)
Pt is losing hair and would like to know what she can take.  Has tingling and numbness in her right arm.

## 2020-05-31 NOTE — Assessment & Plan Note (Signed)
Follow-up liver function panel and continue atorvastatin

## 2020-06-01 ENCOUNTER — Ambulatory Visit: Payer: Self-pay | Attending: Critical Care Medicine

## 2020-06-01 ENCOUNTER — Other Ambulatory Visit: Payer: Self-pay

## 2020-06-01 DIAGNOSIS — Z1159 Encounter for screening for other viral diseases: Secondary | ICD-10-CM

## 2020-06-01 DIAGNOSIS — Z114 Encounter for screening for human immunodeficiency virus [HIV]: Secondary | ICD-10-CM

## 2020-06-02 LAB — HCV AB W REFLEX TO QUANT PCR: HCV Ab: <0.1 s/co ratio (ref 0.0–0.9)

## 2020-06-02 LAB — HCV INTERPRETATION

## 2020-06-02 LAB — HIV ANTIBODY (ROUTINE TESTING W REFLEX): HIV Screen 4th Generation wRfx: NONREACTIVE

## 2020-06-12 ENCOUNTER — Ambulatory Visit (HOSPITAL_COMMUNITY): Payer: Self-pay

## 2020-06-14 ENCOUNTER — Other Ambulatory Visit: Payer: Self-pay

## 2020-06-14 ENCOUNTER — Encounter: Payer: Self-pay | Admitting: Critical Care Medicine

## 2020-06-14 ENCOUNTER — Ambulatory Visit (HOSPITAL_COMMUNITY)
Admission: RE | Admit: 2020-06-14 | Discharge: 2020-06-14 | Disposition: A | Discharge status: Home / Self Care | Payer: Self-pay | Source: Ambulatory Visit | Attending: Critical Care Medicine | Admitting: Critical Care Medicine

## 2020-06-14 DIAGNOSIS — D3502 Benign neoplasm of left adrenal gland: Secondary | ICD-10-CM | POA: Insufficient documentation

## 2020-06-14 MED ORDER — GADOBUTROL 1 MMOL/ML IV SOLN
8.0000 mL | Freq: Once | INTRAVENOUS | Status: AC | PRN
  Administered 2020-06-14: 8 mL via INTRAVENOUS

## 2020-06-23 LAB — HM DIABETES EYE EXAM

## 2020-07-05 ENCOUNTER — Other Ambulatory Visit: Payer: Self-pay

## 2020-07-05 MED FILL — Metformin HCl Tab 500 MG: ORAL | 30 days supply | Qty: 60 | Fill #0 | Status: AC

## 2020-07-05 MED FILL — Atorvastatin Calcium Tab 20 MG (Base Equivalent): ORAL | 30 days supply | Qty: 30 | Fill #0 | Status: AC

## 2020-07-07 ENCOUNTER — Other Ambulatory Visit: Payer: Self-pay

## 2020-07-12 NOTE — Progress Notes (Signed)
Subjective:    Patient ID: Allison [place], female    DOB: 17-Jun-1985, 35 y.o.   MRN: [id number] History of Present Illness: 35 y.o.F former fulp PCP. Here to tfr care  03/08/20 Hx T2DM   Only saw Dr Chapman Fitch 1X 02/04/20: This visit was assisted with Anderson Malta from [place] interpretation from [facility] interpreters This patient is seen today by way of a telephone visit to transition care.  She was last seen November 17 of this year by Dr. Chapman Fitch.  At that time for the diabetes the patient was started on Metformin with a gradual titration up.  A1c at that visit was 6.5.  Patient states since that time her blood sugars have been anywhere from 113-117 in the morning fasting and at night 2 hours after dinner 109-123.  She has successfully changed her diet she may have lost some weight she is unsure of that.  Another issue is that she has had abdominal pain with an umbilical hernia and went to the emergency room early November with a CT scan showing the abdominal umbilical hernia present with minimal fatty tissue.  Subsequent MRI of the abdomen showed a small adenoma of the left adrenal gland benign in nature.  Recommendations for 27-month follow-up given for MRI.  The note from November states the patient was referred to general surgery but she never went due to finances.  She states her abdominal pain is slightly better but occasionally is consistent with gas and flatulence.  And pushing on the abdomen itself with her fat of her abdomen.  Another issue is that she has glaucoma and is run out of her eyedrops is now having headaches and change in vision.  She does not have an eye doctor as of yet as she does not have insurance.  04/12/2020 Spanish interpreter Aveline assisted in this visit through [employer] Patient is seen in return follow-up with type 2 diabetes.  She brings her glucose log with her blood sugars are in the 100-1 20 range with the highest postprandial at 150.  She has changed her diet  substantially as well.  She maintains Metformin alone.  Patient also has an umbilical hernia which is been less troublesome recently with decreased abdominal pain.  She does get her Pap smears with her gynecologist and just had this last year.  She is still receiving Depo Provera through gynecology.  Patient does have a left adrenal adenoma which is quite small repeat scan will be scheduled for later this spring.  Patient does have severe glaucoma and is on 2 different eyedrops is yet to see ophthalmology they pushed her appointment out to April.  She still has headaches with this.  05/31/20 This is a telephone visit the patient did not have technology for video.  The visit was assisted by Spanish interpreter [employer] interpreters.  Patient is returning in follow-up for diabetes type 2, left adrenal adenoma, hyperlipidemia, glaucoma both eyes.  Patient states since the last visit she has developed tingling and numbness in the right upper arm with some weakness in the hand.  The left shoulder is weak and she states nothing seems to make it better it is made worse if she keeps it in one position for long periods of time.  The symptoms only began 2 weeks ago.  She did not injure the shoulder.  Another complaint is that of loss of hair on the top part of the scalp.  She denies any hirsutism.  She is on Depo-Provera injections.  Note  the patient does have a full orange card and [place] discount.  She is due up an MRI follow-up of the left adrenal adenoma.  She is yet to receive her HIV and hepatitis C screening labs.  Patient will have an appointment with her ophthalmologist in April to follow-up on her glaucoma she does stay on her eyedrops for glaucoma.  She states her blood sugar in the morning is 112 fasting occasionally in the afternoon is 1 40-1 70 after eating.  She is compliant with the Metformin twice daily. Note the patient has received her Covid booster vaccine  07/19/20 This patient  is seen in return follow-up for glaucoma type 2 diabetes right arm numbness and alopecia.  Her blood sugars are given to me in a diary format and are in excellent range between 90 and 110 with the highest postprandial blood sugar 150.  Patient maintains the metformin as prescribed.  She is also on the atorvastatin daily.  She still complains of some hair loss.  Her thyroid functions were normal.  She did see ophthalmology and they prescribed timolol for her mild glaucoma.  She has an appointment upcoming with neurology June 10.  The MRI was performed and showed benign left adrenal adenoma no additional follow-up indicated.  Patient has no other complaints except for a rash on the right foot.  She has lost 15 pounds of weight since November and is following an excellent diet and exercise program. Note hepatitis C HIV studies were negative  Wt Readings from Last 3 Encounters: 07/19/20 : 182 lb 3.2 oz (82.6 kg) 04/12/20 : 187 lb 3.2 oz (84.9 kg) 02/04/20 : 197 lb 12.8 oz (89.7 kg)  Past Medical History:  Diagnosis Date  . Diabetes mellitus without complication ([place])    Phreesia 04/18/2020  . Glaucoma    Phreesia 04/18/2020  . No pertinent past medical history      Family History  Problem Relation Age of Onset  . Hypertension Mother   . Diabetes Father      Social History   Socioeconomic History  . Marital status: Single    Spouse name: Not on file  . Number of children: Not on file  . Years of education: Not on file  . Highest education level: Not on file  Occupational History  . Not on file  Tobacco Use  . Smoking status: Never Smoker  . Smokeless tobacco: Never Used  Vaping Use  . Vaping Use: Never used  Substance and Sexual Activity  . Alcohol use: Not Currently  . Drug use: Never  . Sexual activity: Yes  Other Topics Concern  . Not on file  Social History Narrative  . Not on file   Social Determinants of Health   Financial Resource Strain: Not on file  Food  Insecurity: Not on file  Transportation Needs: Not on file  Physical Activity: Not on file  Stress: Not on file  Social Connections: Not on file  Intimate Partner Violence: Not on file     No Known Allergies   Outpatient Medications Prior to Visit  Medication Sig Dispense Refill  . atorvastatin (LIPITOR) 20 MG tablet TAKE 1 TABLET (20 MG TOTAL) BY MOUTH DAILY. 90 tablet 2  . Blood Glucose Monitoring Suppl (TRUE METRIX METER) w/Device KIT Use as directed to check blood sugars 1 kit 0  . glucose blood (TRUE METRIX BLOOD GLUCOSE TEST) test strip Use as instructed to initially check blood sugars twice per day- fasting in the morning and  before bedtime 100 each 12  . medroxyPROGESTERone (DEPO-PROVERA) 150 MG/ML injection Inject 150 mg into the muscle every 3 (three) months.     . metFORMIN (GLUCOPHAGE) 500 MG tablet TAKE 1 TABLET (500 MG TOTAL) BY MOUTH 2 (TWO) TIMES DAILY WITH A MEAL. 180 tablet 2  . timolol (BETIMOL) 0.5 % ophthalmic solution PLACE 1 DROP INTO BOTH EYES 2 (TWO) TIMES DAILY. 10 mL 3  . TRUEplus Lancets 28G MISC USE TO CHECK BLOOD SUGARS 100 each 11  . fluorometholone (FML) 0.1 % ophthalmic suspension PLACE 1 DROP INTO BOTH EYES DAILY. (Patient not taking: Reported on 07/19/2020) 5 mL 2  . ibuprofen (ADVIL,MOTRIN) 200 MG tablet Take 200-400 mg by mouth every 6 (six) hours as needed (for pain or headaches).  (Patient not taking: Reported on 07/19/2020)    . timolol (TIMOPTIC) 0.5 % ophthalmic solution 1 drop 2 (two) times daily. (Patient not taking: Reported on 07/19/2020)     No facility-administered medications prior to visit.      Review of Systems  Constitutional: Negative.   HENT: Negative.   Eyes: Positive for visual disturbance.  Respiratory: Negative.   Cardiovascular: Negative.   Gastrointestinal: Negative for abdominal pain.  Genitourinary: Negative.   Musculoskeletal: Negative.   Skin: Negative.   Neurological: Positive for weakness and numbness. Negative for  headaches.       R arm weak and numbness  Psychiatric/Behavioral: Negative.        Objective:   Physical Exam   T   Vitals:   07/19/20 0838  BP: 133/80  Pulse: 60  Resp: 16  SpO2: 100%  Weight: 182 lb 3.2 oz (82.6 kg)  Height: $Remove'5\' 2"'CIQMArs$  (1.575 m)    Gen: Pleasant, well-nourished, in no distress,  normal affect  ENT: No lesions,  mouth clear,  oropharynx clear, no postnasal drip  Neck: No JVD, no TMG, no carotid bruits  Lungs: No use of accessory muscles, no dullness to percussion, clear without rales or rhonchi  Cardiovascular: RRR, heart sounds normal, no murmur or gallops, no peripheral edema  Abdomen: soft and NT, no HSM,  BS normal  Musculoskeletal: No deformities, no cyanosis or clubbing  Neuro: alert, non focal  Skin: tinea pedis  Assessment & Plan:  I personally reviewed all images and lab data in the [facility] system as well as any outside material available during this office visit and agree with the  radiology impressions.   New onset type 2 diabetes mellitus ([place]) Type 2 diabetes well controlled no change in medications  Hyperlipidemia due to type 2 diabetes mellitus ([place]) Lipid at goal continue atorvastatin  Benign Adenoma of right adrenal gland Last imaging March 2022 unchanged is benign no further imaging needed  Alopecia Recommended patient obtain skin in hair vitamin supplement  Numbness and tingling of right arm Patient encouraged to keep upcoming appointment with neurology  Glaucoma of both eyes Continue timolol eyedrops  Tinea pedis Bilateral tinea pedis have prescribed Lotrisone cream   [place] was seen today for diabetes.  Diagnoses and all orders for this visit:  New onset type 2 diabetes mellitus ([place])  Hyperlipidemia due to type 2 diabetes mellitus ([place])  Benign Adenoma of right adrenal gland  Alopecia  Numbness and tingling of right arm  Glaucoma of both eyes, unspecified glaucoma type  Tinea pedis of both feet  Other  orders -     clotrimazole-betamethasone (LOTRISONE) cream; Apply 1 application topically daily.

## 2020-07-19 ENCOUNTER — Other Ambulatory Visit: Payer: Self-pay

## 2020-07-19 ENCOUNTER — Encounter: Payer: Self-pay | Admitting: Critical Care Medicine

## 2020-07-19 ENCOUNTER — Ambulatory Visit: Payer: Self-pay | Attending: Critical Care Medicine | Admitting: Critical Care Medicine

## 2020-07-19 VITALS — BP 133/80 | HR 60 | Resp 16 | Ht 62.0 in | Wt 182.2 lb

## 2020-07-19 DIAGNOSIS — R202 Paresthesia of skin: Secondary | ICD-10-CM

## 2020-07-19 DIAGNOSIS — E785 Hyperlipidemia, unspecified: Secondary | ICD-10-CM

## 2020-07-19 DIAGNOSIS — E1169 Type 2 diabetes mellitus with other specified complication: Secondary | ICD-10-CM

## 2020-07-19 DIAGNOSIS — H409 Unspecified glaucoma: Secondary | ICD-10-CM

## 2020-07-19 DIAGNOSIS — B353 Tinea pedis: Secondary | ICD-10-CM | POA: Insufficient documentation

## 2020-07-19 DIAGNOSIS — E119 Type 2 diabetes mellitus without complications: Secondary | ICD-10-CM

## 2020-07-19 DIAGNOSIS — L659 Nonscarring hair loss, unspecified: Secondary | ICD-10-CM

## 2020-07-19 DIAGNOSIS — R200 Anesthesia of skin: Secondary | ICD-10-CM

## 2020-07-19 DIAGNOSIS — D3501 Benign neoplasm of right adrenal gland: Secondary | ICD-10-CM

## 2020-07-19 HISTORY — DX: Tinea pedis: B35.3

## 2020-07-19 MED ORDER — CLOTRIMAZOLE-BETAMETHASONE 1-0.05 % EX CREA
1.0000 "application " | TOPICAL_CREAM | Freq: Every day | CUTANEOUS | 0 refills | Status: DC
  Filled 2020-07-19: qty 30, 30d supply, fill #0

## 2020-07-19 NOTE — Assessment & Plan Note (Signed)
Bilateral tinea pedis have prescribed Lotrisone cream

## 2020-07-19 NOTE — Assessment & Plan Note (Signed)
Type 2 diabetes well controlled no change in medications

## 2020-07-19 NOTE — Assessment & Plan Note (Signed)
Patient encouraged to keep upcoming appointment with neurology

## 2020-07-19 NOTE — Assessment & Plan Note (Signed)
Last imaging March 2022 unchanged is benign no further imaging needed

## 2020-07-19 NOTE — Patient Instructions (Signed)
Begin Lotrisone cream to the feet twice daily for the rash  Continue metformin and atorvastatin daily  Stay on your eyedrops as prescribed  Begin a hair and nail vitamin supplement daily  If hair loss continues please notify us we will refer you to dermatology  Please keep your June 10 appointment with neurology  Return to Dr. Joya Gaskins in 4 months  Keep up the good work on your diet and exercise plan it is producing wonderful results

## 2020-07-19 NOTE — Assessment & Plan Note (Signed)
- 

## 2020-07-19 NOTE — Assessment & Plan Note (Signed)
Recommended patient obtain skin in hair vitamin supplement

## 2020-07-19 NOTE — Assessment & Plan Note (Signed)
Lipid at goal continue atorvastatin

## 2020-07-29 ENCOUNTER — Other Ambulatory Visit: Payer: Self-pay

## 2020-07-29 ENCOUNTER — Other Ambulatory Visit: Payer: Self-pay | Admitting: Pharmacist

## 2020-07-29 DIAGNOSIS — E119 Type 2 diabetes mellitus without complications: Secondary | ICD-10-CM

## 2020-07-29 MED ORDER — TRUE METRIX BLOOD GLUCOSE TEST VI STRP
ORAL_STRIP | 12 refills | Status: DC
  Filled 2020-07-29: qty 100, 50d supply, fill #0
  Filled 2020-09-08: qty 100, 50d supply, fill #1
  Filled 2020-11-12 – 2020-11-19 (×2): qty 100, 50d supply, fill #2
  Filled 2021-07-25: qty 100, 50d supply, fill #0

## 2020-07-29 MED FILL — Metformin HCl Tab 500 MG: ORAL | 30 days supply | Qty: 60 | Fill #1 | Status: AC

## 2020-07-29 MED FILL — Lancets: 25 days supply | Qty: 100 | Fill #0 | Status: AC

## 2020-07-29 MED FILL — Atorvastatin Calcium Tab 20 MG (Base Equivalent): ORAL | 30 days supply | Qty: 30 | Fill #1 | Status: AC

## 2020-07-30 ENCOUNTER — Other Ambulatory Visit: Payer: Self-pay

## 2020-08-27 ENCOUNTER — Encounter: Payer: Self-pay | Admitting: Neurology

## 2020-08-27 ENCOUNTER — Ambulatory Visit (INDEPENDENT_AMBULATORY_CARE_PROVIDER_SITE_OTHER): Payer: Self-pay | Admitting: Neurology

## 2020-08-27 ENCOUNTER — Other Ambulatory Visit: Payer: Self-pay

## 2020-08-27 VITALS — BP 109/72 | HR 48 | Ht 63.0 in | Wt 180.0 lb

## 2020-08-27 DIAGNOSIS — M7521 Bicipital tendinitis, right shoulder: Secondary | ICD-10-CM

## 2020-08-27 DIAGNOSIS — G5621 Lesion of ulnar nerve, right upper limb: Secondary | ICD-10-CM

## 2020-08-27 DIAGNOSIS — R519 Headache, unspecified: Secondary | ICD-10-CM

## 2020-08-27 MED ORDER — NORTRIPTYLINE HCL 10 MG PO CAPS: ORAL_CAPSULE | ORAL | 3 refills | Status: DC

## 2020-08-27 NOTE — Progress Notes (Signed)
[facility] Clinic Note - Initial Visit   Date: 08/27/20  Allison Monroe MRN: [id number] DOB: 1985/12/10   Dear Dr. Joya Gaskins:  Thank you for your kind referral of Allison Monroe for consultation of right arm tingling. Although her history is well known to you, please allow [place] to reiterate it for the purpose of our medical record. The patient was accompanied to the clinic by self.  Sharene Butters, PA in our office who is spanish-speaking was present to interpret.  History of Present Illness: Allison Monroe is a 35 y.o. right-handed female presenting for evaluation of right arm tingling/arm, pain, and headaches.  For the past 3-4 months, she has right shoulder pain.  It is achy and throbbing, worse with palpation.  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  She also complains of tingling in the right forearm and hand, especially when she is driving and rests her arm on the door.  No weakness or the arm. She has generalized fatigue sensation in the arm. She also complains of headaches.  Headaches involve the parietal region, described as throbbing.  Occur 2-3 times per week.  She endorses light sensitivity and gets relief with sitting in a dark, quiet room.    Out-side paper records, electronic medical record, and images have been reviewed where available and summarized as:  Lab Results  Component Value Date   HGBA1C 6.5 (A) 02/04/2020   No results found for: VITAMINB12 No results found for: TSH No results found for: ESRSEDRATE, POCTSEDRATE  Past Medical History:  Diagnosis Date   Diabetes mellitus without complication ([place])    Phreesia 04/18/2020   Glaucoma    Phreesia 04/18/2020   No pertinent past medical history     Past Surgical History:  Procedure Laterality Date   NO PAST SURGERIES       Medications:  Outpatient Encounter Medications as of 08/27/2020  Medication Sig   atorvastatin  (LIPITOR) 20 MG tablet TAKE 1 TABLET (20 MG TOTAL) BY MOUTH DAILY.   Blood Glucose Monitoring Suppl (TRUE METRIX METER) w/Device KIT Use as directed to check blood sugars   clotrimazole-betamethasone (LOTRISONE) cream Apply 1 application topically daily.   glucose blood (TRUE METRIX BLOOD GLUCOSE TEST) test strip Use as instructed to initially check blood sugars twice per day- fasting in the morning and before bedtime   medroxyPROGESTERone (DEPO-PROVERA) 150 MG/ML injection Inject 150 mg into the muscle every 3 (three) months.    metFORMIN (GLUCOPHAGE) 500 MG tablet TAKE 1 TABLET (500 MG TOTAL) BY MOUTH 2 (TWO) TIMES DAILY WITH A MEAL.   timolol (BETIMOL) 0.5 % ophthalmic solution PLACE 1 DROP INTO BOTH EYES 2 (TWO) TIMES DAILY.   TRUEplus Lancets 28G MISC USE TO CHECK BLOOD SUGARS   [DISCONTINUED] omeprazole (PRILOSEC) 20 MG capsule Take 1 capsule (20 mg total) by mouth daily. (Patient not taking: No sig reported)   No facility-administered encounter medications on file as of 08/27/2020.    Allergies: No Known Allergies  Family History: Family History  Problem Relation Age of Onset   Hypertension Mother    Diabetes Father     Social History: Social History   Tobacco Use   Smoking status: Never   Smokeless tobacco: Never  Vaping Use   Vaping Use: Never used  Substance Use Topics   Alcohol use: Not Currently   Drug use: Never   Social History   Social History Narrative   Living with husband  and daughters   Right handed    Vital Signs:  BP 109/72   Pulse (!) 48   Ht 5\' 3"  (1.6 m)   Wt 180 lb (81.6 kg)   SpO2 100%   BMI 31.89 kg/m    Neurological Exam: MENTAL STATUS including orientation to time, place, person, recent and remote memory, attention span and concentration, language, and fund of knowledge is normal.  Speech is not dysarthric.  CRANIAL NERVES: II:  No visual field defects.    III-IV-VI: Pupils equal round and reactive to light.  Normal conjugate,  extra-ocular eye movements in all directions of gaze.  No nystagmus.  No ptosis.   V:  Normal facial sensation.    VII:  Normal facial symmetry and movements.   VIII:  Normal hearing and vestibular function.   IX-X:  Normal palatal movement.   XI:  Normal shoulder shrug and head rotation.   XII:  Normal tongue strength and range of motion, no deviation or fasciculation.  MOTOR:  There is point tenderness over the right AC joint.  No atrophy, fasciculations or abnormal movements.  No pronator drift.   Upper Extremity:  Right  Left  Deltoid  5/5   5/5   Biceps  5/5   5/5   Triceps  5/5   5/5   Infraspinatus 5/5  5/5  Medial pectoralis 5/5  5/5  Wrist extensors  5/5   5/5   Wrist flexors  5/5   5/5   Finger extensors  5/5   5/5   Finger flexors  5/5   5/5   Dorsal interossei  5/5   5/5   Abductor pollicis  5/5   5/5   Tone (Ashworth scale)  0  0   Lower Extremity:  Right  Left  Hip flexors  5/5   5/5   Hip extensors  5/5   5/5   Adductor 5/5  5/5  Abductor 5/5  5/5  Knee flexors  5/5   5/5   Knee extensors  5/5   5/5   Dorsiflexors  5/5   5/5   Plantarflexors  5/5   5/5   Toe extensors  5/5   5/5   Toe flexors  5/5   5/5   Tone (Ashworth scale)  0  0   MSRs:  Right        Left                  brachioradialis 2+  2+  biceps 2+  2+  triceps 2+  2+  patellar 2+  2+  ankle jerk 2+  2+  Hoffman no  no  plantar response down  down   SENSORY:  Normal and symmetric perception of light touch, pinprick, vibration, and proprioception.  Tinel's sign is negative at the right wrist and elbow.  COORDINATION/GAIT: Normal finger-to- nose-finger and heel-to-shin.  Intact rapid alternating movements bilaterally.  Able to rise from a chair without using arms.  Gait narrow based and stable. Tandem and stressed gait intact.    IMPRESSION: Chronic migraine - Start nortriptyline 10mg  at bedtime for 2 week, then increase to 2 tablet at bedtime - Limit OTC NSAIDs and tylenol to twice pe  rweek  2.  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  Right shoulder pain is  suggestive of biceps tendonitis.  She has point tenderness over the anterior shoulder  - OK to take ibuprofen for pain  - Follow-up with PCP  Return to clinic in 4 months.    Thank you for allowing me to participate in patient's care.  If I can answer any additional questions, I would be pleased to do so.    Sincerely,    Khaliq Turay K. Posey Pronto, DO

## 2020-08-27 NOTE — Patient Instructions (Signed)
Start nortriptyline 10mg  at bedtime for 2 week, then increase to 2 tablet at bedtime  If your hand symptoms get worse, please consider

## 2020-09-08 ENCOUNTER — Other Ambulatory Visit: Payer: Self-pay

## 2020-09-08 MED FILL — Metformin HCl Tab 500 MG: ORAL | 30 days supply | Qty: 60 | Fill #2 | Status: AC

## 2020-09-08 MED FILL — Lancets: 25 days supply | Qty: 100 | Fill #1 | Status: AC

## 2020-09-08 MED FILL — Atorvastatin Calcium Tab 20 MG (Base Equivalent): ORAL | 30 days supply | Qty: 30 | Fill #2 | Status: AC

## 2020-09-10 ENCOUNTER — Other Ambulatory Visit: Payer: Self-pay

## 2020-10-13 ENCOUNTER — Other Ambulatory Visit: Payer: Self-pay

## 2020-10-13 MED FILL — Atorvastatin Calcium Tab 20 MG (Base Equivalent): ORAL | 30 days supply | Qty: 30 | Fill #3 | Status: AC

## 2020-10-13 MED FILL — Metformin HCl Tab 500 MG: ORAL | 30 days supply | Qty: 60 | Fill #3 | Status: AC

## 2020-10-15 ENCOUNTER — Other Ambulatory Visit: Payer: Self-pay

## 2020-11-12 ENCOUNTER — Other Ambulatory Visit: Payer: Self-pay

## 2020-11-12 MED FILL — Metformin HCl Tab 500 MG: ORAL | 30 days supply | Qty: 60 | Fill #4 | Status: CN

## 2020-11-12 MED FILL — Atorvastatin Calcium Tab 20 MG (Base Equivalent): ORAL | 30 days supply | Qty: 30 | Fill #4 | Status: CN

## 2020-11-12 MED FILL — Lancets: 25 days supply | Qty: 100 | Fill #2 | Status: CN

## 2020-11-16 ENCOUNTER — Other Ambulatory Visit: Payer: Self-pay

## 2020-11-19 ENCOUNTER — Other Ambulatory Visit: Payer: Self-pay

## 2020-11-19 MED FILL — Metformin HCl Tab 500 MG: ORAL | 30 days supply | Qty: 60 | Fill #4 | Status: AC

## 2020-11-19 MED FILL — Atorvastatin Calcium Tab 20 MG (Base Equivalent): ORAL | 30 days supply | Qty: 30 | Fill #4 | Status: AC

## 2020-11-19 MED FILL — Lancets: 25 days supply | Qty: 100 | Fill #2 | Status: AC

## 2020-11-22 ENCOUNTER — Ambulatory Visit: Payer: Self-pay | Admitting: Critical Care Medicine

## 2020-11-22 NOTE — Progress Notes (Signed)
Established Patient Office Visit  Subjective:  Patient ID: Allison Monroe, female    DOB: [birth year]  Age: 35 y.o. MRN: [id number]  CC:  Chief Complaint  Patient presents with   Diabetes     HPI Allison Monroe presents for follow-up of diabetes and hyperlipidemia.  Patient also continues to have alopecia with significant hair loss despite a skin and hair vitamin supplement.  Patient states her blood sugars have been well controlled.  On arrival hemoglobin A1c in fact is good at 6.1.  Patient does need a flu vaccine and is willing to receive this at this visit. Patient does complain of the oral vesicular rash and has had this recurrently on a perioral basis There are no other complaints at this time  She did have an eye exam in April of this year showing no retinopathy  Patient states the foot rash has resolved on Lotrisone cream Past Medical History:  Diagnosis Date   Diabetes mellitus without complication ([place])    Phreesia 04/18/2020   Glaucoma    Phreesia 04/18/2020   No pertinent past medical history    Tinea pedis 07/19/2020    Past Surgical History:  Procedure Laterality Date   NO PAST SURGERIES      Family History  Problem Relation Age of Onset   Hypertension Mother    Diabetes Father     Social History   Socioeconomic History   Marital status: Single    Spouse name: Not on file   Number of children: Not on file   Years of education: Not on file   Highest education level: Not on file  Occupational History   Not on file  Tobacco Use   Smoking status: Never   Smokeless tobacco: Never  Vaping Use   Vaping Use: Never used  Substance and Sexual Activity   Alcohol use: Not Currently   Drug use: Never   Sexual activity: Yes  Other Topics Concern   Not on file  Social History Narrative   Living with husband and daughters   Right handed   Social Determinants of Health   Financial Resource Strain: Not on file  Food Insecurity: Not on file   Transportation Needs: Not on file  Physical Activity: Not on file  Stress: Not on file  Social Connections: Not on file  Intimate Partner Violence: Not on file    Outpatient Medications Prior to Visit  Medication Sig Dispense Refill   Blood Glucose Monitoring Suppl (TRUE METRIX METER) w/Device KIT Use as directed to check blood sugars 1 kit 0   glucose blood (TRUE METRIX BLOOD GLUCOSE TEST) test strip Use as instructed to initially check blood sugars twice per day- fasting in the morning and before bedtime 100 each 12   medroxyPROGESTERone (DEPO-PROVERA) 150 MG/ML injection Inject 150 mg into the muscle every 3 (three) months.      timolol (BETIMOL) 0.5 % ophthalmic solution PLACE 1 DROP INTO BOTH EYES 2 (TWO) TIMES DAILY. 10 mL 3   TRUEplus Lancets 28G MISC USE TO CHECK BLOOD SUGARS 100 each 11   atorvastatin (LIPITOR) 20 MG tablet TAKE 1 TABLET (20 MG TOTAL) BY MOUTH DAILY. 90 tablet 2   metFORMIN (GLUCOPHAGE) 500 MG tablet TAKE 1 TABLET (500 MG TOTAL) BY MOUTH 2 (TWO) TIMES DAILY WITH A MEAL. 180 tablet 2   clotrimazole-betamethasone (LOTRISONE) cream Apply 1 application topically daily. (Patient not taking: Reported on 11/23/2020) 30 g 0   nortriptyline (PAMELOR) 10 MG capsule Start nortriptyline 47m at  bedtime for 2 week, then increase to 2 tablet at bedtime (Patient not taking: Reported on 11/23/2020) 60 capsule 3   No facility-administered medications prior to visit.    No Known Allergies  ROS Review of Systems  Constitutional:  Negative for chills, diaphoresis and fever.  HENT:  Negative for congestion, ear discharge, ear pain, hearing loss, nosebleeds, sore throat and tinnitus.   Eyes:  Negative for photophobia and discharge.  Respiratory:  Negative for cough, shortness of breath, wheezing and stridor.        No excess mucus  Cardiovascular:  Negative for chest pain, palpitations and leg swelling.  Gastrointestinal:  Negative for abdominal pain, blood in stool, constipation,  diarrhea, nausea and vomiting.  Endocrine: Negative for polydipsia.  Genitourinary:  Negative for dysuria, flank pain, frequency, hematuria and urgency.  Musculoskeletal:  Negative for back pain, myalgias and neck pain.  Skin:  Positive for rash.       Alopecia  Allergic/Immunologic: Negative for environmental allergies.  Neurological:  Negative for dizziness, tremors, seizures, weakness and headaches.  Hematological:  Does not bruise/bleed easily.  Psychiatric/Behavioral:  Negative for hallucinations and suicidal ideas. The patient is not nervous/anxious.   All other systems reviewed and are negative.    Objective:    Physical Exam Vitals reviewed.  Constitutional:      Appearance: Normal appearance. She is well-developed. She is obese. She is not diaphoretic.  HENT:     Head: Normocephalic and atraumatic.     Nose: No nasal deformity, septal deviation, mucosal edema or rhinorrhea.     Right Sinus: No maxillary sinus tenderness or frontal sinus tenderness.     Left Sinus: No maxillary sinus tenderness or frontal sinus tenderness.     Mouth/Throat:     Pharynx: No oropharyngeal exudate.  Eyes:     General: No scleral icterus.    Conjunctiva/sclera: Conjunctivae normal.     Pupils: Pupils are equal, round, and reactive to light.  Neck:     Thyroid: No thyromegaly.     Vascular: No carotid bruit or JVD.     Trachea: Trachea normal. No tracheal tenderness or tracheal deviation.  Cardiovascular:     Rate and Rhythm: Normal rate and regular rhythm.     Chest Wall: PMI is not displaced.     Pulses: Normal pulses. No decreased pulses.     Heart sounds: Normal heart sounds, S1 normal and S2 normal. Heart sounds not distant. No murmur heard. No systolic murmur is present.  No diastolic murmur is present.    No friction rub. No gallop. No S3 or S4 sounds.  Pulmonary:     Effort: No tachypnea, accessory muscle usage or respiratory distress.     Breath sounds: No stridor. No decreased  breath sounds, wheezing, rhonchi or rales.  Chest:     Chest wall: No tenderness.  Abdominal:     General: Bowel sounds are normal. There is no distension.     Palpations: Abdomen is soft. Abdomen is not rigid.     Tenderness: There is no abdominal tenderness. There is no guarding or rebound.  Musculoskeletal:        General: Normal range of motion.     Cervical back: Normal range of motion and neck supple. No edema, erythema or rigidity. No muscular tenderness. Normal range of motion.  Lymphadenopathy:     Head:     Right side of head: No submental or submandibular adenopathy.     Left side of head: No  submental or submandibular adenopathy.     Cervical: No cervical adenopathy.  Skin:    General: Skin is warm and dry.     Coloration: Skin is not pale.     Findings: Rash present.     Nails: There is no clubbing.     Comments: Vesicular rash lower lip compatible with HSV  Neurological:     Mental Status: She is alert and oriented to person, place, and time.     Sensory: No sensory deficit.  Psychiatric:        Speech: Speech normal.        Behavior: Behavior normal.    BP (!) 100/58   Pulse (!) 50   Ht $R'5\' 3"'qx$  (1.6 m)   Wt 181 lb 3.2 oz (82.2 kg)   SpO2 100%   BMI 32.10 kg/m  Wt Readings from Last 3 Encounters:  11/23/20 181 lb 3.2 oz (82.2 kg)  08/27/20 180 lb (81.6 kg)  07/19/20 182 lb 3.2 oz (82.6 kg)     There are no preventive care reminders to display for this patient.   There are no preventive care reminders to display for this patient.  No results found for: TSH Lab Results  Component Value Date   WBC 10.5 01/20/2020   HGB 12.9 01/20/2020   HCT 39.7 01/20/2020   MCV 88.4 01/20/2020   PLT 311 01/20/2020   Lab Results  Component Value Date   NA 137 02/04/2020   K 4.5 02/04/2020   CO2 22 02/04/2020   GLUCOSE 108 (H) 02/04/2020   BUN 15 02/04/2020   CREATININE 0.86 02/04/2020   BILITOT 0.6 01/20/2020   ALKPHOS 77 01/20/2020   AST 22 01/20/2020    ALT 29 01/20/2020   PROT 7.3 01/20/2020   ALBUMIN 3.7 01/20/2020   CALCIUM 11.0 (H) 02/04/2020   ANIONGAP 12 01/20/2020   Lab Results  Component Value Date   CHOL 197 02/20/2020   Lab Results  Component Value Date   HDL 44 02/20/2020   Lab Results  Component Value Date   LDLCALC 137 (H) 02/20/2020   Lab Results  Component Value Date   TRIG 09-19-1985 02/20/2020   Lab Results  Component Value Date   CHOLHDL 4.5 (H) 02/20/2020   Lab Results  Component Value Date   HGBA1C 6.1 11/23/2020      Assessment & Plan:   Problem List Items Addressed This Visit       Endocrine   Hyperlipidemia due to type 2 diabetes mellitus ([place])    Lipid at goal based on May 2022 numbers continue atorvastatin refill sent      Relevant Medications   atorvastatin (LIPITOR) 20 MG tablet   metFORMIN (GLUCOPHAGE) 500 MG tablet   New onset type 2 diabetes mellitus ([facility]) - Primary    Type 2 diabetes controlled A1c now 6.1 continue metformin      Relevant Medications   atorvastatin (LIPITOR) 20 MG tablet   metFORMIN (GLUCOPHAGE) 500 MG tablet   Other Relevant Orders   HgB A1c (Completed)     Musculoskeletal and Integument   Alopecia    Persistent alopecia will refer to dermatology patient does have the orange card      Relevant Orders   Ambulatory referral to Dermatology   RESOLVED: Tinea pedis    This has resolved      Relevant Medications   acyclovir (ZOVIRAX) 400 MG tablet     Other   Glaucoma of both eyes    Patient  followed by ophthalmology with glaucoma on timolol eyedrops no changes      HSV (herpes simplex virus) infection    Perioral HSV will plan 5 days of acyclovir 400 mg 5 times daily      Relevant Medications   acyclovir (ZOVIRAX) 400 MG tablet    Meds ordered this encounter  Medications   acyclovir (ZOVIRAX) 400 MG tablet    Sig: Take 1 tablet (400 mg total) by mouth 5 (five) times daily for 5 days.    Dispense:  25 tablet    Refill:  0   atorvastatin  (LIPITOR) 20 MG tablet    Sig: TAKE 1 TABLET (20 MG TOTAL) BY MOUTH DAILY.    Dispense:  90 tablet    Refill:  2   metFORMIN (GLUCOPHAGE) 500 MG tablet    Sig: TAKE 1 TABLET (500 MG TOTAL) BY MOUTH 2 (TWO) TIMES DAILY WITH A MEAL.    Dispense:  180 tablet    Refill:  2     Follow-up: Return in about 5 months (around 04/25/2021).    Asencion Noble, MD

## 2020-11-23 ENCOUNTER — Encounter: Payer: Self-pay | Admitting: Critical Care Medicine

## 2020-11-23 ENCOUNTER — Ambulatory Visit: Payer: Self-pay | Attending: Critical Care Medicine | Admitting: Critical Care Medicine

## 2020-11-23 ENCOUNTER — Other Ambulatory Visit: Payer: Self-pay

## 2020-11-23 VITALS — BP 100/58 | HR 50 | Ht 63.0 in | Wt 181.2 lb

## 2020-11-23 DIAGNOSIS — B009 Herpesviral infection, unspecified: Secondary | ICD-10-CM

## 2020-11-23 DIAGNOSIS — E119 Type 2 diabetes mellitus without complications: Secondary | ICD-10-CM

## 2020-11-23 DIAGNOSIS — H409 Unspecified glaucoma: Secondary | ICD-10-CM

## 2020-11-23 DIAGNOSIS — E1169 Type 2 diabetes mellitus with other specified complication: Secondary | ICD-10-CM

## 2020-11-23 DIAGNOSIS — E785 Hyperlipidemia, unspecified: Secondary | ICD-10-CM

## 2020-11-23 DIAGNOSIS — Z23 Encounter for immunization: Secondary | ICD-10-CM

## 2020-11-23 DIAGNOSIS — L659 Nonscarring hair loss, unspecified: Secondary | ICD-10-CM

## 2020-11-23 DIAGNOSIS — B353 Tinea pedis: Secondary | ICD-10-CM

## 2020-11-23 HISTORY — DX: Herpesviral infection, unspecified: B00.9

## 2020-11-23 LAB — POCT GLYCOSYLATED HEMOGLOBIN (HGB A1C): HbA1c, POC (controlled diabetic range): 6.1 % (ref 0.0–7.0)

## 2020-11-23 MED ORDER — METFORMIN HCL 500 MG PO TABS
ORAL_TABLET | Freq: Two times a day (BID) | ORAL | 2 refills | Status: DC
Start: 2020-11-23 — End: 2021-04-25
  Filled 2020-11-23: qty 180, fill #0
  Filled 2021-01-03: qty 60, 30d supply, fill #0
  Filled 2021-02-09: qty 60, 30d supply, fill #1
  Filled 2021-03-28: qty 60, 30d supply, fill #0

## 2020-11-23 MED ORDER — ATORVASTATIN CALCIUM 20 MG PO TABS
ORAL_TABLET | Freq: Every day | ORAL | 2 refills | Status: DC
  Filled 2020-11-23: qty 90, fill #0
  Filled 2021-01-03: qty 30, 30d supply, fill #0
  Filled 2021-02-09: qty 30, 30d supply, fill #1
  Filled 2021-03-28: qty 30, 30d supply, fill #0

## 2020-11-23 MED ORDER — ACYCLOVIR 400 MG PO TABS
400.0000 mg | ORAL_TABLET | Freq: Every day | ORAL | 0 refills | Status: AC
  Filled 2020-11-23: qty 25, 5d supply, fill #0

## 2020-11-23 NOTE — Assessment & Plan Note (Signed)
Persistent alopecia will refer to dermatology patient does have the orange card

## 2020-11-23 NOTE — Assessment & Plan Note (Signed)
Patient followed by ophthalmology with glaucoma on timolol eyedrops no changes

## 2020-11-23 NOTE — Assessment & Plan Note (Signed)
Type 2 diabetes controlled A1c now 6.1 continue metformin

## 2020-11-23 NOTE — Patient Instructions (Signed)
Dermatology referral will be made for your hair loss with Dr. Erin Hearing at family practice  Take acyclovir 1 tablet 5 times daily for 5 days for the rash on the lip  Flu vaccine was given  Refills on atorvastatin and metformin sent to our pharmacy  Continue healthy diet as we discussed  Return to see Dr. Joya Gaskins 5 months

## 2020-11-23 NOTE — Assessment & Plan Note (Signed)
Lipid at goal based on May 2022 numbers continue atorvastatin refill sent

## 2020-11-23 NOTE — Assessment & Plan Note (Signed)
This has resolved.

## 2020-11-23 NOTE — Assessment & Plan Note (Signed)
Perioral HSV will plan 5 days of acyclovir 400 mg 5 times daily

## 2020-12-27 ENCOUNTER — Ambulatory Visit: Payer: Self-pay | Admitting: Neurology

## 2020-12-29 ENCOUNTER — Emergency Department (HOSPITAL_COMMUNITY): Payer: Self-pay

## 2020-12-29 ENCOUNTER — Emergency Department (HOSPITAL_COMMUNITY)
Admission: EM | Admit: 2020-12-29 | Discharge: 2020-12-29 | Disposition: A | Discharge status: Home / Self Care | Payer: Self-pay | Attending: Emergency Medicine | Admitting: Emergency Medicine

## 2020-12-29 ENCOUNTER — Encounter (HOSPITAL_COMMUNITY): Payer: Self-pay | Admitting: Emergency Medicine

## 2020-12-29 DIAGNOSIS — M722 Plantar fascial fibromatosis: Secondary | ICD-10-CM | POA: Insufficient documentation

## 2020-12-29 DIAGNOSIS — E785 Hyperlipidemia, unspecified: Secondary | ICD-10-CM | POA: Insufficient documentation

## 2020-12-29 DIAGNOSIS — S93402A Sprain of unspecified ligament of left ankle, initial encounter: Secondary | ICD-10-CM | POA: Insufficient documentation

## 2020-12-29 DIAGNOSIS — Z7984 Long term (current) use of oral hypoglycemic drugs: Secondary | ICD-10-CM | POA: Insufficient documentation

## 2020-12-29 DIAGNOSIS — X501XXA Overexertion from prolonged static or awkward postures, initial encounter: Secondary | ICD-10-CM | POA: Insufficient documentation

## 2020-12-29 DIAGNOSIS — E1169 Type 2 diabetes mellitus with other specified complication: Secondary | ICD-10-CM | POA: Insufficient documentation

## 2020-12-29 NOTE — ED Provider Notes (Addendum)
[facility] EMERGENCY DEPARTMENT Provider Note   CSN: [id number] Arrival date & time: 12/29/20  7829     History Chief Complaint  Patient presents with   Ankle Pain    Allison Monroe is a 35 y.o. female who presents to the emergency department today for further evaluation of left ankle and foot pain that has been constant and ongoing over the last 2 weeks.  She states that she initially rolled the ankle via inversion mechanism while carrying a bottle of water.  She initially had associated swelling and ecchymosis which is since resolved.  She rates her ankle pain mild in severity.  It is worse with ambulation.  She denies any other injury.  No weakness/numbness of the foot or toes. She also complains of pain on the plantar aspect of the foot which started after the ankle injury. It is worse in the morning when she takes her first steps and with dorsiflexion of the toes. This is also worse with ambulation. Pain is improved with naproxen.   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   Hyperlipidemia due to type 2 diabetes mellitus ([place]) 02/22/2020    Past Surgical History:  Procedure Laterality Date   NO PAST SURGERIES       OB History     Gravida      Para      Term      Preterm      AB      Living  2      SAB      IAB      Ectopic      Multiple      Live Births              Family History  Problem  Relation Age of Onset   Hypertension Mother    Diabetes Father     Social History   Tobacco Use   Smoking status: Never   Smokeless tobacco: Never  Vaping Use   Vaping Use: Never used  Substance Use Topics   Alcohol use: Not Currently   Drug use: Never    Home Medications Prior to Admission medications   Medication Sig Start Date End Date Taking? Authorizing Provider  atorvastatin (LIPITOR) 20 MG tablet TAKE 1 TABLET (20 MG TOTAL) BY MOUTH DAILY. 11/23/20 11/23/21  Elsie Stain, MD  Blood Glucose Monitoring Suppl (TRUE METRIX METER) w/Device KIT Use as directed to check blood sugars 02/04/20   Fulp, Cammie, MD  glucose blood (TRUE METRIX BLOOD GLUCOSE TEST) test strip Use as instructed to initially check blood sugars twice per day- fasting in the morning and before bedtime 07/29/20   Elsie Stain, MD  medroxyPROGESTERone (DEPO-PROVERA) 150 MG/ML injection Inject 150 mg into the muscle every 3 (three) months.     [provider]  metFORMIN (GLUCOPHAGE) 500 MG tablet TAKE 1 TABLET (500 MG TOTAL)  BY MOUTH 2 (TWO) TIMES DAILY WITH A MEAL. 11/23/20 11/23/21  Elsie Stain, MD  timolol (BETIMOL) 0.5 % ophthalmic solution PLACE 1 DROP INTO BOTH EYES 2 (TWO) TIMES DAILY. 05/31/20 05/31/21  Elsie Stain, MD  TRUEplus Lancets 28G MISC USE TO CHECK BLOOD SUGARS 02/04/20 02/03/21  Fulp, Cammie, MD  omeprazole (PRILOSEC) 20 MG capsule Take 1 capsule (20 mg total) by mouth daily. Patient not taking: No sig reported 04/10/18 01/02/20  Wardell Honour, MD    Allergies    Patient has no known allergies.  Review of Systems   Review of Systems  All other systems reviewed and are negative.  Physical Exam Updated Vital Signs BP 109/64 (BP Location: Left Arm)   Pulse 70   Temp 99 F (37.2 C) (Oral)   Resp 18   Ht _0  (1.6 m)   Wt 81.6 kg   SpO2 100%   BMI 31.89 kg/m   Physical Exam Vitals reviewed.  Constitutional:      Appearance: Normal appearance.  HENT:      Head: Normocephalic and atraumatic.  Eyes:     General:        Right eye: No discharge.        Left eye: No discharge.     Conjunctiva/sclera: Conjunctivae normal.  Pulmonary:     Effort: Pulmonary effort is normal.  Feet:     Comments: Left lateral ankle tenderness primarily over the PTFL.  There is also tenderness over the plantar aspect of the foot primarily medial to the calcaneus.  Minimal swelling of the ankle.  No ecchymosis.  2+ dorsalis pedis pulse in the left foot.  Good sensation in the toes.  Normal range of motion in the toes. Skin:    General: Skin is warm and dry.     Findings: No rash.  Neurological:     General: No focal deficit present.     Mental Status: She is alert.  Psychiatric:        Mood and Affect: Mood normal.        Behavior: Behavior normal.    ED Results / Procedures / Treatments   Labs (all labs ordered are listed, but only abnormal results are displayed) Labs Reviewed - No data to display  EKG None  Radiology DG Ankle Complete Left  Result Date: 12/29/2020 CLINICAL DATA:  Twisting injury 2 weeks ago with persistent pain, swelling and difficulty bearing weight. EXAM: LEFT ANKLE COMPLETE - 3+ VIEW COMPARISON:  None. FINDINGS: The mineralization and alignment are normal. There is no evidence of acute fracture or dislocation. The joint spaces are preserved. Possible mild diffuse soft tissue swelling around the ankle without evidence of foreign body or soft tissue emphysema. IMPRESSION: No evidence of acute fracture or dislocation. Possible mild diffuse soft tissue swelling around the ankle. Electronically Signed   By: Richardean Sale M.D.   On: 12/29/2020 09:12    Procedures Procedures   Medications Ordered in ED Medications - No data to display  ED Course  I have reviewed the triage vital signs and the nursing notes.  Pertinent labs & imaging results that were available during my care of the patient were reviewed by me and considered in my  medical decision making (see chart for details).    MDM Rules/Calculators/A&P                          Allison Monroe is a 35 y.o. female  who presents to the emergency department for further evaluation of left ankle pain.  Imaging of the left ankle was negative for fracture or dislocation.  This is likely a ligamentous sprain.  She also likely has plantar fasciitis secondary to abnormal gait from the injured ankle.  I discussed various exercises at the bedside with the patient.  Patient expressed understanding.  All questions and concerns addressed.  Strict return precautions were given.  I will have her follow-up with her primary care within the next couple of weeks if this is not improved.  She is safe for discharge.   Final Clinical Impression(s) / ED Diagnoses Final diagnoses:  Sprain of left ankle, unspecified ligament, initial encounter  Plantar fasciitis    Rx / DC Orders ED Discharge Orders     None        Hendricks Limes, PA-C 12/29/20 1254    Hendricks Limes, PA-C 12/29/20 1256    Pattricia Boss, MD 12/30/20 1601

## 2020-12-29 NOTE — ED Triage Notes (Signed)
Pt reports twisting her ankle on 9/29 and continues to have pain and swelling.

## 2020-12-29 NOTE — ED Notes (Signed)
Patient verbalizes understanding of d/c instructions. Opportunities for questions and answers were provided. Pt d/c from ED via wheelchair and ambulated to car.

## 2020-12-29 NOTE — Discharge Instructions (Signed)
You were seen and evaluated in the emergency department today for evaluation of left ankle pain.  As we discussed, this is likely a sprain from when he rolled it couple weeks ago.  This will get better with time.  Please perform the exercises I demonstrated for you today.  You also likely have plantar fasciitis from walking abnormally after you injured your ankle.  Please follow-up with your primary care provider if this does not get better within the next couple weeks.  Naproxen for pain and swelling.  Ankle/foot exercises: Perform the ABCs both in uppercase and lowercase with the left foot twice per day.  You may also roll the bottom of your foot with a tennis ball going through various ranges of motion.  Repeat this daily.

## 2021-01-03 ENCOUNTER — Other Ambulatory Visit: Payer: Self-pay

## 2021-01-06 ENCOUNTER — Ambulatory Visit: Payer: Self-pay | Admitting: Physician Assistant

## 2021-01-26 ENCOUNTER — Ambulatory Visit: Payer: Self-pay | Admitting: Physician Assistant

## 2021-02-09 ENCOUNTER — Other Ambulatory Visit: Payer: Self-pay

## 2021-03-28 ENCOUNTER — Other Ambulatory Visit: Payer: Self-pay

## 2021-04-23 NOTE — Progress Notes (Signed)
Established Patient Office Visit  Subjective:  Patient ID: Allison Monroe, female    DOB: [birth year]  Age: 36 y.o. MRN: [id number]  CC: Diabetes follow-up and severe reflux  HPI Spanish video interpreter Norva Pavlov was used #[id number] Allison Monroe presents for type 2 diabetes and hyperlipidemia follow-up and severe reflux.  The patient's been complaining of abdominal pain which is lower abdominal in nature with cramping associated with some diarrhea and nausea without vomiting.  She is taking standard metformin 500 mg twice daily.  On arrival blood sugars 129 A1c is 6.5.  The patient notes significant acid reflux on a daily basis.  She denies any chest pain.  She does note some fatigue.  Patient is doing urine microalbumin at this visit and a foot exam Patient still suffers from alopecia never received a call from dermatology.  She does have the orange card [employer] discount.  Note she often will skip breakfast will have peanut butter and jelly for dinner with an apple and banana.  She will skip dinner at times.  She will occasionally have chicken with rice and salads.  Patient's nausea occurs after eating and at nighttime after taking metformin.    Past Medical History:  Diagnosis Date   Diabetes mellitus without complication (HCC)    Phreesia 04/18/2020   Glaucoma    Phreesia 04/18/2020   HSV (herpes simplex virus) infection 11/23/2020   No pertinent past medical history    Tinea pedis 07/19/2020    Past Surgical History:  Procedure Laterality Date   NO PAST SURGERIES      Family History  Problem Relation Age of Onset   Hypertension Mother    Diabetes Father     Social History   Socioeconomic History   Marital status: Single    Spouse name: Not on file   Number of children: Not on file   Years of education: Not on file   Highest education level: Not on file  Occupational History   Not on file  Tobacco Use   Smoking status: Never   Smokeless tobacco: Never   Vaping Use   Vaping Use: Never used  Substance and Sexual Activity   Alcohol use: Not Currently   Drug use: Never   Sexual activity: Yes  Other Topics Concern   Not on file  Social History Narrative   Living with husband and daughters   Right handed   Social Determinants of Health   Financial Resource Strain: Not on file  Food Insecurity: Not on file  Transportation Needs: Not on file  Physical Activity: Not on file  Stress: Not on file  Social Connections: Not on file  Intimate Partner Violence: Not on file    Outpatient Medications Prior to Visit  Medication Sig Dispense Refill   Blood Glucose Monitoring Suppl (TRUE METRIX METER) w/Device KIT Use as directed to check blood sugars 1 kit 0   glucose blood (TRUE METRIX BLOOD GLUCOSE TEST) test strip Use as instructed to initially check blood sugars twice per day- fasting in the morning and before bedtime 100 each 12   medroxyPROGESTERone (DEPO-PROVERA) 150 MG/ML injection Inject 150 mg into the muscle every 3 (three) months.      timolol (BETIMOL) 0.5 % ophthalmic solution PLACE 1 DROP INTO BOTH EYES 2 (TWO) TIMES DAILY. 10 mL 3   atorvastatin (LIPITOR) 20 MG tablet TAKE 1 TABLET (20 MG TOTAL) BY MOUTH DAILY. 90 tablet 2   metFORMIN (GLUCOPHAGE) 500 MG tablet TAKE 1 TABLET (500 MG  TOTAL) BY MOUTH 2 (TWO) TIMES DAILY WITH A MEAL. 180 tablet 2   No facility-administered medications prior to visit.    No Known Allergies  ROS Review of Systems  Constitutional:  Negative for chills, diaphoresis and fever.  HENT:  Negative for congestion, hearing loss, nosebleeds, sore throat and tinnitus.   Eyes:  Negative for photophobia and redness.  Respiratory:  Negative for cough, shortness of breath, wheezing and stridor.   Cardiovascular:  Negative for chest pain, palpitations and leg swelling.  Gastrointestinal:  Positive for abdominal pain, diarrhea and nausea. Negative for blood in stool, constipation and vomiting.       Reflux  symptoms consistent with GERD  Endocrine: Negative for polydipsia.  Genitourinary:  Negative for dysuria, flank pain, frequency, hematuria and urgency.  Musculoskeletal:  Negative for back pain, myalgias and neck pain.  Skin:  Negative for rash.  Allergic/Immunologic: Negative for environmental allergies.  Neurological:  Negative for dizziness, tremors, seizures, weakness and headaches.  Hematological:  Does not bruise/bleed easily.  Psychiatric/Behavioral:  Negative for suicidal ideas. The patient is not nervous/anxious.      Objective:    Physical Exam Vitals reviewed.  Constitutional:      Appearance: Normal appearance. She is well-developed. She is obese. She is not diaphoretic.  HENT:     Head: Normocephalic and atraumatic.     Right Ear: Tympanic membrane normal.     Left Ear: Tympanic membrane normal.     Nose: Nose normal. No nasal deformity, septal deviation, mucosal edema or rhinorrhea.     Right Sinus: No maxillary sinus tenderness or frontal sinus tenderness.     Left Sinus: No maxillary sinus tenderness or frontal sinus tenderness.     Mouth/Throat:     Mouth: Mucous membranes are moist.     Pharynx: Oropharynx is clear. No oropharyngeal exudate.     Comments: Good dentition Eyes:     General: No scleral icterus.    Conjunctiva/sclera: Conjunctivae normal.     Pupils: Pupils are equal, round, and reactive to light.  Neck:     Thyroid: No thyromegaly.     Vascular: No carotid bruit or JVD.     Trachea: Trachea normal. No tracheal tenderness or tracheal deviation.  Cardiovascular:     Rate and Rhythm: Normal rate and regular rhythm.     Chest Wall: PMI is not displaced.     Pulses: Normal pulses. No decreased pulses.     Heart sounds: Normal heart sounds, S1 normal and S2 normal. Heart sounds not distant. No murmur heard. No systolic murmur is present.  No diastolic murmur is present.    No friction rub. No gallop. No S3 or S4 sounds.  Pulmonary:     Effort:  Pulmonary effort is normal. No tachypnea, accessory muscle usage or respiratory distress.     Breath sounds: Normal breath sounds. No stridor. No decreased breath sounds, wheezing, rhonchi or rales.  Chest:     Chest wall: No tenderness.  Abdominal:     General: Bowel sounds are normal. There is no distension.     Palpations: Abdomen is soft. Abdomen is not rigid.     Tenderness: There is abdominal tenderness. There is no guarding or rebound.     Comments: Tender in both lower groin areas without rebound or guarding  Musculoskeletal:        General: Normal range of motion.     Cervical back: Normal range of motion and neck supple. No edema, erythema or  rigidity. No muscular tenderness. Normal range of motion.     Comments: Foot exam normal  Lymphadenopathy:     Head:     Right side of head: No submental or submandibular adenopathy.     Left side of head: No submental or submandibular adenopathy.     Cervical: No cervical adenopathy.  Skin:    General: Skin is warm and dry.     Coloration: Skin is not pale.     Findings: No rash.     Nails: There is no clubbing.  Neurological:     General: No focal deficit present.     Mental Status: She is alert and oriented to person, place, and time. Mental status is at baseline.     Sensory: No sensory deficit.  Psychiatric:        Mood and Affect: Mood normal.        Speech: Speech normal.        Behavior: Behavior normal.        Thought Content: Thought content normal.        Judgment: Judgment normal.    BP 114/76    Pulse 65    Resp 16    Wt 189 lb 12.8 oz (86.1 kg)    SpO2 100%    BMI 33.62 kg/m  Wt Readings from Last 3 Encounters:  04/25/21 189 lb 12.8 oz (86.1 kg)  12/29/20 180 lb (81.6 kg)  11/23/20 181 lb 3.2 oz (82.2 kg)     Health Maintenance Due  Topic Date Due   COVID-19 Vaccine (4 - Booster for Pfizer series) 06/14/2020   URINE MICROALBUMIN  02/19/2021    There are no preventive care reminders to display for this  patient.  No results found for: TSH Lab Results  Component Value Date   WBC 10.5 01/20/2020   HGB 12.9 01/20/2020   HCT 39.7 01/20/2020   MCV 88.4 01/20/2020   PLT 311 01/20/2020   Lab Results  Component Value Date   NA 137 02/04/2020   K 4.5 02/04/2020   CO2 22 02/04/2020   GLUCOSE 108 (H) 02/04/2020   BUN 15 02/04/2020   CREATININE 0.86 02/04/2020   BILITOT 0.6 01/20/2020   ALKPHOS 77 01/20/2020   AST 22 01/20/2020   ALT 29 01/20/2020   PROT 7.3 01/20/2020   ALBUMIN 3.7 01/20/2020   CALCIUM 11.0 (H) 02/04/2020   ANIONGAP 12 01/20/2020   Lab Results  Component Value Date   CHOL 197 02/20/2020   Lab Results  Component Value Date   HDL 44 02/20/2020   Lab Results  Component Value Date   LDLCALC 137 (H) 02/20/2020   Lab Results  Component Value Date   TRIG 05/28/1985 02/20/2020   Lab Results  Component Value Date   CHOLHDL 4.5 (H) 02/20/2020   Lab Results  Component Value Date   HGBA1C 6.5 04/25/2021      Assessment & Plan:   Problem List Items Addressed This Visit       Digestive   Gastroesophageal reflux disease without esophagitis    Referral to gastroenterology was made and will begin Protonix daily and provide reflux diet      Relevant Medications   pantoprazole (PROTONIX) 40 MG tablet   Other Relevant Orders   Ambulatory referral to Gastroenterology     Endocrine   Hyperlipidemia due to type 2 diabetes mellitus ([place])    Plan to continue atorvastatin check lipid panel      Relevant Medications  atorvastatin (LIPITOR) 20 MG tablet   metFORMIN (GLUCOPHAGE XR) 750 MG 24 hr tablet   New onset type 2 diabetes mellitus (HCC) - Primary    A1c at goal plan to change to metformin XR 750 mg daily to see if this will affect patient's GI symptoms and refer patient to gastroenterology for potential IBS Check urine for microalbumin this visit       Relevant Medications   atorvastatin (LIPITOR) 20 MG tablet   metFORMIN (GLUCOPHAGE XR) 750 MG 24 hr  tablet   Other Relevant Orders   Microalbumin / creatinine urine ratio   HgB A1c (Completed)   POCT glucose (manual entry) (Completed)     Musculoskeletal and Integument   Alopecia    Referral to dermatology will be made again      Relevant Orders   Ambulatory referral to Dermatology     Other   Glaucoma of both eyes    Patient to continue timolol eyedrops       Meds ordered this encounter  Medications   atorvastatin (LIPITOR) 20 MG tablet    Sig: TAKE 1 TABLET (20 MG TOTAL) BY MOUTH DAILY.    Dispense:  90 tablet    Refill:  2   metFORMIN (GLUCOPHAGE XR) 750 MG 24 hr tablet    Sig: Take 1 tablet (750 mg total) by mouth daily with breakfast.    Dispense:  60 tablet    Refill:  2   pantoprazole (PROTONIX) 40 MG tablet    Sig: Take 1 tablet (40 mg total) by mouth daily before breakfast.    Dispense:  30 tablet    Refill:  3    Follow-up: Return in about 4 months (around 08/23/2021).    Asencion Noble, MD

## 2021-04-25 ENCOUNTER — Other Ambulatory Visit: Payer: Self-pay

## 2021-04-25 ENCOUNTER — Encounter: Payer: Self-pay | Admitting: Critical Care Medicine

## 2021-04-25 ENCOUNTER — Ambulatory Visit: Payer: Self-pay | Attending: Critical Care Medicine | Admitting: Critical Care Medicine

## 2021-04-25 VITALS — BP 114/76 | HR 65 | Resp 16 | Wt 189.8 lb

## 2021-04-25 DIAGNOSIS — E119 Type 2 diabetes mellitus without complications: Secondary | ICD-10-CM

## 2021-04-25 DIAGNOSIS — H409 Unspecified glaucoma: Secondary | ICD-10-CM

## 2021-04-25 DIAGNOSIS — L659 Nonscarring hair loss, unspecified: Secondary | ICD-10-CM

## 2021-04-25 DIAGNOSIS — K219 Gastro-esophageal reflux disease without esophagitis: Secondary | ICD-10-CM

## 2021-04-25 DIAGNOSIS — E1169 Type 2 diabetes mellitus with other specified complication: Secondary | ICD-10-CM

## 2021-04-25 DIAGNOSIS — E785 Hyperlipidemia, unspecified: Secondary | ICD-10-CM

## 2021-04-25 LAB — GLUCOSE, POCT (MANUAL RESULT ENTRY): POC Glucose: 129 mg/dl — AB (ref 70–99)

## 2021-04-25 LAB — POCT GLYCOSYLATED HEMOGLOBIN (HGB A1C): HbA1c, POC (controlled diabetic range): 6.5 % (ref 0.0–7.0)

## 2021-04-25 MED ORDER — ATORVASTATIN CALCIUM 20 MG PO TABS
ORAL_TABLET | Freq: Every day | ORAL | 2 refills | Status: DC
  Filled 2021-04-25: qty 30, 30d supply, fill #0
  Filled 2021-05-23: qty 30, 30d supply, fill #1
  Filled 2021-06-20: qty 30, 30d supply, fill #2
  Filled 2021-07-25: qty 30, 30d supply, fill #3
  Filled 2021-08-24: qty 30, 30d supply, fill #4

## 2021-04-25 MED ORDER — PANTOPRAZOLE SODIUM 40 MG PO TBEC
40.0000 mg | DELAYED_RELEASE_TABLET | Freq: Every day | ORAL | 3 refills | Status: DC
  Filled 2021-04-25: qty 30, 30d supply, fill #0
  Filled 2021-05-23: qty 30, 30d supply, fill #1
  Filled 2021-06-20: qty 30, 30d supply, fill #2
  Filled 2021-07-25: qty 30, 30d supply, fill #3

## 2021-04-25 MED ORDER — METFORMIN HCL ER 750 MG PO TB24
750.0000 mg | ORAL_TABLET | Freq: Every day | ORAL | 2 refills | Status: DC
  Filled 2021-04-25: qty 30, 30d supply, fill #0
  Filled 2021-05-23: qty 30, 30d supply, fill #1
  Filled 2021-06-20: qty 30, 30d supply, fill #2
  Filled 2021-07-25: qty 30, 30d supply, fill #3
  Filled 2021-08-24: qty 30, 30d supply, fill #4

## 2021-04-25 NOTE — Assessment & Plan Note (Signed)
Referral to gastroenterology was made and will begin Protonix daily and provide reflux diet

## 2021-04-25 NOTE — Assessment & Plan Note (Signed)
Referral to dermatology will be made again

## 2021-04-25 NOTE — Assessment & Plan Note (Signed)
Plan to continue atorvastatin check lipid panel

## 2021-04-25 NOTE — Assessment & Plan Note (Addendum)
A1c at goal plan to change to metformin XR 750 mg daily to see if this will affect patient's GI symptoms and refer patient to gastroenterology for potential IBS Check urine for microalbumin this visit

## 2021-04-25 NOTE — Assessment & Plan Note (Signed)
Patient to continue timolol eyedrops

## 2021-04-25 NOTE — Patient Instructions (Signed)
Start metformin XR 750mg  daily  at breakfast  Continue atorvastatin  Start pantoprazole 15 min before breakfast  Follow a reflux diet see attached  Referral to stomach and skin doctor made  Urine for microalbumin was sent  Return in 4 months  Iniciar metformina XR 750 mg diarios en el desayuno  Continuar con atorvastatina  Iniciar pantoprazol 15 min antes del desayuno  Seguir una dieta de reflujo ver adjunto  Remisin a mdico de Paramedic y piel realizada  Se envi orina para microalbmina  Regreso en 4 meses

## 2021-04-26 ENCOUNTER — Other Ambulatory Visit: Payer: Self-pay | Admitting: Critical Care Medicine

## 2021-04-26 ENCOUNTER — Encounter: Payer: Self-pay | Admitting: Critical Care Medicine

## 2021-04-26 DIAGNOSIS — R809 Proteinuria, unspecified: Secondary | ICD-10-CM | POA: Insufficient documentation

## 2021-04-26 LAB — MICROALBUMIN / CREATININE URINE RATIO
Creatinine, Urine: 146.1 mg/dL
Microalb/Creat Ratio: 352 mg/g creat — ABNORMAL HIGH (ref 0–29)
Microalbumin, Urine: 515 ug/mL

## 2021-04-27 ENCOUNTER — Telehealth: Payer: Self-pay

## 2021-04-27 NOTE — Telephone Encounter (Signed)
-----   Message from Elsie Stain, MD sent at 04/26/2021  6:24 PM EST ----- Let pt know she his protein in urine  need her to come in for a metabolic panel  order entered.

## 2021-04-27 NOTE — Telephone Encounter (Signed)
Pt was called and is aware of results, DOB was confirmed and appointment has been made.  Interpreter ID# [id number]

## 2021-04-28 ENCOUNTER — Ambulatory Visit: Payer: Self-pay | Attending: Critical Care Medicine

## 2021-04-28 DIAGNOSIS — R809 Proteinuria, unspecified: Secondary | ICD-10-CM

## 2021-04-29 ENCOUNTER — Telehealth: Payer: Self-pay

## 2021-04-29 LAB — COMPREHENSIVE METABOLIC PANEL
ALT: 23 IU/L (ref 0–32)
AST: 19 IU/L (ref 0–40)
Albumin/Globulin Ratio: 1.6 (ref 1.2–2.2)
Albumin: 4.6 g/dL (ref 3.8–4.8)
Alkaline Phosphatase: 109 IU/L (ref 44–121)
BUN/Creatinine Ratio: 17 (ref 9–23)
BUN: 14 mg/dL (ref 6–20)
Bilirubin Total: 0.7 mg/dL (ref 0.0–1.2)
CO2: 20 mmol/L (ref 20–29)
Calcium: 10.2 mg/dL (ref 8.7–10.2)
Chloride: 104 mmol/L (ref 96–106)
Creatinine, Ser: 0.83 mg/dL (ref 0.57–1.00)
Globulin, Total: 2.8 g/dL (ref 1.5–4.5)
Glucose: 116 mg/dL — ABNORMAL HIGH (ref 70–99)
Potassium: 4.5 mmol/L (ref 3.5–5.2)
Sodium: 142 mmol/L (ref 134–144)
Total Protein: 7.4 g/dL (ref 6.0–8.5)
eGFR: 94 mL/min/{1.73_m2} (ref >59)

## 2021-04-29 NOTE — Telephone Encounter (Signed)
Pt was called and is aware of results, DOB was confirmed.  ?

## 2021-04-29 NOTE — Telephone Encounter (Signed)
-----   Message from Elsie Stain, MD sent at 04/29/2021  6:18 AM EST ----- Let pt know blood work is normal  no change on diabetic medication

## 2021-05-23 ENCOUNTER — Other Ambulatory Visit: Payer: Self-pay

## 2021-05-26 ENCOUNTER — Ambulatory Visit (INDEPENDENT_AMBULATORY_CARE_PROVIDER_SITE_OTHER): Payer: Self-pay | Admitting: Family Medicine

## 2021-05-26 ENCOUNTER — Other Ambulatory Visit: Payer: Self-pay

## 2021-05-26 DIAGNOSIS — L649 Androgenic alopecia, unspecified: Secondary | ICD-10-CM

## 2021-05-26 DIAGNOSIS — L259 Unspecified contact dermatitis, unspecified cause: Secondary | ICD-10-CM | POA: Insufficient documentation

## 2021-05-26 MED ORDER — ROGAINE WOMENS 5 % EX FOAM: 1.0000 mL | Freq: Two times a day (BID) | CUTANEOUS | 3 refills | Status: DC

## 2021-05-26 MED ORDER — TRIAMCINOLONE ACETONIDE 0.025 % EX OINT: 1.0000 "application " | TOPICAL_OINTMENT | Freq: Two times a day (BID) | CUTANEOUS | 0 refills | Status: DC

## 2021-05-26 MED ORDER — ROGAINE WOMENS 5 % EX FOAM: 1.0000 mL | Freq: Two times a day (BID) | CUTANEOUS | 0 refills | Status: DC

## 2021-05-26 NOTE — Progress Notes (Signed)
? ? ?  SUBJECTIVE:  ? ?CHIEF COMPLAINT / HPI:  ? ?Hair Loss ?Allison Monroe is a 36 y.o. female who presents to the [facility] dermatology clinic today to discuss ongoing hearing loss since 2021, when she was diagnosed with diabetes.  Reports that her hair used to be thick and now it is thinned.  She occasionally feels that her's scalp is sensitive to the sun feels like it is burning.  She has tried shampoo with biotin but has not noticed any change.  She often awakens with hair on her pillow.  She used to wear hair pulled back but she recently cut it short in hopes this would help but it has made no change.  No recent illnesses or life stressors.  No rashes on her skin apart from her face.  No pain or swelling in her joints. Her mother passed away in her 59's but still had relatively full hair. Her older sister has had some hair loss that she believes is related to her pulling it out from stress. ? ?Skin Bumps ?Noticed some bumps on her face. She uses a brush she bought at [facility] and soap which has helped. It doesn't hurt but is itchy on occasion. ? ?PERTINENT  PMH / PSH: DM ? ?OBJECTIVE:  ? ?BP 108/68   Pulse 70   Ht '5\' 3"'$  (1.6 m)   Wt 194 lb 12.8 oz (88.4 kg)   SpO2 100%   BMI 34.51 kg/m?   ? ?General: NAD, pleasant, able to participate in exam ?HEENT: Normocephalic, thinning of hair at vertex, no localized areas of alopecia ?Skin: warm and dry, right cheek has some small erythematous macules ?Psych: Normal affect and mood ? ? ? ? ? ? ? ? ? ? ?ASSESSMENT/PLAN:  ? ?Androgenetic alopecia ?Given history, pattern and presentation feel this is most likely benign and related to normal age hair loss. No other systemic findings concerning for autoimmune conditions. Recommend topical minoxidil. Rx provided today.  ? ?Contact dermatitis ?No obvious etiology but area on face appears consistent with potential contact dermatitis. Will trial topical steroids, patient advised to wean as symptoms improve and only use 1-2x weekly  once more controlled. Return if worsening or if no improvement. ?  ? ? ?Sharion Settler, DO ?[place]  ? ?

## 2021-05-26 NOTE — Assessment & Plan Note (Signed)
No obvious etiology but area on face appears consistent with potential contact dermatitis. Will trial topical steroids, patient advised to wean as symptoms improve and only use 1-2x weekly once more controlled. Return if worsening or if no improvement. ?

## 2021-05-26 NOTE — Assessment & Plan Note (Signed)
Given history, pattern and presentation feel this is most likely benign and related to normal age hair loss. No other systemic findings concerning for autoimmune conditions. Recommend topical minoxidil. Rx provided today.  ?

## 2021-05-26 NOTE — Patient Instructions (Addendum)
Fue maravilloso verte hoy. ? ?Hoy hablamos de: ? ?-Creemos que se trata de una p?rdida de cabello normal relacionada con la edad. Nada peligroso o preocupante. ?-Puedes probar Rogaine para tu cabello. ?selo dos veces al d?a. Recuerde que si deja de hacer esto, el cabello que creci? como resultado de la medicaci?n se caer?. ? ?[place] Pert por elegir Medicina familiar [place]. ? ?Llame al [phone] si tiene alguna pregunta sobre la cita de hoy. ? ?Aseg?rese de programar un seguimiento en la recepci?n antes de irse hoy. ? ?Sharion Settler, D.O. ?PGY-2 Medicina Familiar  ?

## 2021-06-20 ENCOUNTER — Other Ambulatory Visit: Payer: Self-pay

## 2021-07-25 ENCOUNTER — Other Ambulatory Visit: Payer: Self-pay | Admitting: Critical Care Medicine

## 2021-07-25 ENCOUNTER — Other Ambulatory Visit: Payer: Self-pay

## 2021-07-25 DIAGNOSIS — E119 Type 2 diabetes mellitus without complications: Secondary | ICD-10-CM

## 2021-07-26 ENCOUNTER — Other Ambulatory Visit: Payer: Self-pay

## 2021-07-26 MED ORDER — TRUEPLUS LANCETS 28G MISC
0 refills | Status: DC
  Filled 2021-07-26: qty 100, 25d supply, fill #0

## 2021-07-29 ENCOUNTER — Other Ambulatory Visit: Payer: Self-pay

## 2021-08-23 ENCOUNTER — Ambulatory Visit: Payer: Self-pay | Admitting: Critical Care Medicine

## 2021-08-24 ENCOUNTER — Other Ambulatory Visit: Payer: Self-pay | Admitting: Critical Care Medicine

## 2021-08-24 ENCOUNTER — Other Ambulatory Visit: Payer: Self-pay

## 2021-08-24 ENCOUNTER — Encounter: Payer: Self-pay | Admitting: Critical Care Medicine

## 2021-08-24 ENCOUNTER — Ambulatory Visit: Payer: Self-pay | Attending: Critical Care Medicine | Admitting: Critical Care Medicine

## 2021-08-24 VITALS — BP 108/72 | HR 50 | Wt 183.2 lb

## 2021-08-24 DIAGNOSIS — H409 Unspecified glaucoma: Secondary | ICD-10-CM

## 2021-08-24 DIAGNOSIS — R200 Anesthesia of skin: Secondary | ICD-10-CM

## 2021-08-24 DIAGNOSIS — E1169 Type 2 diabetes mellitus with other specified complication: Secondary | ICD-10-CM

## 2021-08-24 DIAGNOSIS — L649 Androgenic alopecia, unspecified: Secondary | ICD-10-CM

## 2021-08-24 DIAGNOSIS — R202 Paresthesia of skin: Secondary | ICD-10-CM

## 2021-08-24 DIAGNOSIS — R809 Proteinuria, unspecified: Secondary | ICD-10-CM

## 2021-08-24 DIAGNOSIS — E119 Type 2 diabetes mellitus without complications: Secondary | ICD-10-CM

## 2021-08-24 DIAGNOSIS — E785 Hyperlipidemia, unspecified: Secondary | ICD-10-CM

## 2021-08-24 DIAGNOSIS — K219 Gastro-esophageal reflux disease without esophagitis: Secondary | ICD-10-CM

## 2021-08-24 LAB — GLUCOSE, POCT (MANUAL RESULT ENTRY): POC Glucose: 91 mg/dl (ref 70–99)

## 2021-08-24 MED ORDER — TRUE METRIX BLOOD GLUCOSE TEST VI STRP
ORAL_STRIP | 2 refills | Status: DC
  Filled 2021-08-24: qty 100, fill #0
  Filled 2021-11-30: qty 100, 50d supply, fill #0
  Filled 2022-01-28: qty 100, 50d supply, fill #1
  Filled 2022-05-25: qty 100, 50d supply, fill #2

## 2021-08-24 MED ORDER — PANTOPRAZOLE SODIUM 40 MG PO TBEC
40.0000 mg | DELAYED_RELEASE_TABLET | Freq: Every day | ORAL | 3 refills | Status: DC
  Filled 2021-08-24 (×2): qty 30, 30d supply, fill #0
  Filled 2021-10-05: qty 90, 90d supply, fill #1

## 2021-08-24 MED ORDER — METFORMIN HCL ER 750 MG PO TB24
750.0000 mg | ORAL_TABLET | Freq: Every day | ORAL | 2 refills | Status: DC
  Filled 2021-10-05: qty 30, 30d supply, fill #0
  Filled 2021-11-01: qty 30, 30d supply, fill #1
  Filled 2021-11-30: qty 30, 30d supply, fill #2
  Filled 2021-12-29: qty 30, 30d supply, fill #3

## 2021-08-24 MED ORDER — ATORVASTATIN CALCIUM 20 MG PO TABS
ORAL_TABLET | Freq: Every day | ORAL | 2 refills | Status: DC
  Filled 2021-10-05: qty 90, 90d supply, fill #0
  Filled 2021-12-29 (×2): qty 90, 90d supply, fill #1

## 2021-08-24 MED ORDER — TRUEPLUS LANCETS 28G MISC
3 refills | Status: DC
  Filled 2021-08-24: qty 100, 50d supply, fill #0
  Filled 2021-11-30: qty 100, 50d supply, fill #1
  Filled 2022-01-28: qty 100, 50d supply, fill #2
  Filled 2022-05-25: qty 100, 50d supply, fill #3

## 2021-08-24 NOTE — Assessment & Plan Note (Signed)
Monitor

## 2021-08-24 NOTE — Telephone Encounter (Signed)
Requested medication (s) are due for refill today- expired Rx  Requested medication (s) are on the active medication list -yes  Future visit scheduled -yes  Last refill: 07/29/20 #[id number]  Notes to clinic: expired Rx  Requested Prescriptions  Pending Prescriptions Disp Refills   glucose blood (TRUE METRIX BLOOD GLUCOSE TEST) test strip 100 each 12    Sig: Use as instructed to initially check blood sugars twice per day- fasting in the morning and before bedtime     Endocrinology: Diabetes - Testing Supplies Passed - 08/24/2021  9:18 AM      Passed - Valid encounter within last 12 months    Recent Outpatient Visits           Today New onset type 2 diabetes mellitus ([place])   [place] Elsie Stain, MD   4 months ago New onset type 2 diabetes mellitus [facility])   [place] Elsie Stain, MD   9 months ago New onset type 2 diabetes mellitus [facility])   [place] Elsie Stain, MD   1 year ago New onset type 2 diabetes mellitus [place])   [place] Elsie Stain, MD   1 year ago New onset type 2 diabetes mellitus [facility])   [place] Elsie Stain, MD       Future Appointments             In 5 months Elsie Stain, MD [place]                Requested Prescriptions  Pending Prescriptions Disp Refills   glucose blood (TRUE METRIX BLOOD GLUCOSE TEST) test strip 100 each 12    Sig: Use as instructed to initially check blood sugars twice per day- fasting in the morning and before bedtime     Endocrinology: Diabetes - Testing Supplies Passed - 08/24/2021  9:18 AM      Passed - Valid encounter within last 12 months    Recent Outpatient Visits           Today New onset type 2 diabetes mellitus ([place])   [place] Elsie Stain, MD   4  months ago New onset type 2 diabetes mellitus [facility])   [place] Elsie Stain, MD   9 months ago New onset type 2 diabetes mellitus [facility])   [place] Elsie Stain, MD   1 year ago New onset type 2 diabetes mellitus [facility])   [place] Elsie Stain, MD   1 year ago New onset type 2 diabetes mellitus [facility])   [place], MD       Future Appointments             In 5 months Joya Gaskins Burnett Harry, MD [place]

## 2021-08-24 NOTE — Assessment & Plan Note (Signed)
Improved with pantoprazole we will not make any changes

## 2021-08-24 NOTE — Assessment & Plan Note (Signed)
As per dermatology we will monitor

## 2021-08-24 NOTE — Assessment & Plan Note (Signed)
This has resolved.

## 2021-08-24 NOTE — Assessment & Plan Note (Signed)
Patient has established follow-up with ophthalmology

## 2021-08-24 NOTE — Progress Notes (Deleted)
Gastroesophageal reflux disease without esophagitis      Referral to gastroenterology was made and will begin Protonix daily and provide reflux diet        Relevant Medications    pantoprazole (PROTONIX) 40 MG tablet    Other Relevant Orders    Ambulatory referral to Gastroenterology    I am reaching out because your Primary Care Provider sent a referral for an appointment at [facility].   Please call our office at [phone] and choose option 1, to get this appointment scheduled    Endocrine    Hyperlipidemia due to type 2 diabetes mellitus ([place])      Plan to continue atorvastatin check lipid panel        Relevant Medications    atorvastatin (LIPITOR) 20 MG tablet    metFORMIN (GLUCOPHAGE XR) 750 MG 24 hr tablet    New onset type 2 diabetes mellitus (HCC) - Primary      A1c at goal plan to change to metformin XR 750 mg daily to see if this will affect patient's GI symptoms and refer patient to gastroenterology for potential IBS Check urine for microalbumin this visit          Relevant Medications    atorvastatin (LIPITOR) 20 MG tablet    metFORMIN (GLUCOPHAGE XR) 750 MG 24 hr tablet    Other Relevant Orders    Microalbumin / creatinine urine ratio    HgB A1c (Completed)    POCT glucose (manual entry) (Completed)        Musculoskeletal and Integument    Alopecia      Referral to dermatology will be made again        Relevant Orders    Ambulatory referral to Dermatology        Other    Glaucoma of both eyes      Patient to continue timolol eyedrops

## 2021-08-24 NOTE — Assessment & Plan Note (Signed)
Patient instructed to eat breakfast lunch and dinner on a regular spacing and was given a lifestyle medicine handout The following Lifestyle Medicine recommendations according to [place] [facility] were discussed and offered to patient who agrees to start the journey:  A. Whole Foods, Plant-based plate comprising of fruits and vegetables, plant-based proteins, whole-grain carbohydrates was discussed in detail with the patient.   A list for source of those nutrients were also provided to the patient.  Patient will use only water or unsweetened tea for hydration. B.  The need to stay away from risky substances including alcohol, smoking; obtaining 7 to 9 hours of restorative sleep, at least 150 minutes of moderate intensity exercise weekly, the importance of healthy social connections,  and stress reduction techniques were discussed.  Patient asked to use metformin in the morning with food

## 2021-08-24 NOTE — Assessment & Plan Note (Signed)
Continue with atorvastatin 

## 2021-08-24 NOTE — Progress Notes (Signed)
Established Patient Office Visit  Subjective   Patient ID: Allison Monroe, female    DOB: 10-Jan-1986  Age: 36 y.o. MRN: [id number]  Chief Complaint  Patient presents with   Diabetes    Patient seen in return follow-up for primary care from gastroenterology perspective she is doing well she is on the Protonix and following a reflux diet her abdominal pain has resolved.  Patient continues to receive Depo-Provera monthly per gynecology.  Patient states her blood sugars however been running high in the mornings in the 140s to 160s.  In the afternoons are in the 100-1 20 range.  She has been skipping breakfast and taking the metformin at lunch.  She sometimes skips dinner.  She occasionally has spells of dizziness and shakiness late in the day.  After she eats this resolves.  She is trying to eat more healthy at this time.  Blood sugar on arrival today is 91 blood pressure 108/72  Review of Systems  Constitutional:  Negative for chills, diaphoresis, fever, malaise/fatigue and weight loss.  HENT:  Negative for congestion, ear discharge, ear pain, hearing loss, nosebleeds, sore throat and tinnitus.   Eyes:  Negative for blurred vision, double vision, photophobia and discharge.  Respiratory:  Negative for cough, hemoptysis, sputum production, shortness of breath, wheezing and stridor.        No excess mucus  Cardiovascular:  Negative for chest pain, palpitations, orthopnea, claudication, leg swelling and PND.  Gastrointestinal:  Negative for abdominal pain, blood in stool, constipation, diarrhea, heartburn, melena, nausea and vomiting.  Genitourinary:  Negative for dysuria, flank pain, frequency, hematuria and urgency.  Musculoskeletal:  Negative for back pain, falls, joint pain, myalgias and neck pain.  Skin:  Negative for itching and rash.  Neurological:  Negative for dizziness, tingling, tremors, sensory change, speech change, focal weakness, seizures, loss of consciousness, weakness and  headaches.  Endo/Heme/Allergies:  Negative for environmental allergies and polydipsia. Does not bruise/bleed easily.  Psychiatric/Behavioral:  Negative for depression, hallucinations, memory loss, substance abuse and suicidal ideas. The patient is not nervous/anxious and does not have insomnia.   All other systems reviewed and are negative.    Objective:     BP 108/72   Pulse (!) 50   Wt 183 lb 3.2 oz (83.1 kg)   BMI 32.45 kg/m    Physical Exam Vitals reviewed.  Constitutional:      Appearance: Normal appearance. She is well-developed. She is obese. She is not diaphoretic.  HENT:     Head: Normocephalic and atraumatic.     Nose: No nasal deformity, septal deviation, mucosal edema or rhinorrhea.     Right Sinus: No maxillary sinus tenderness or frontal sinus tenderness.     Left Sinus: No maxillary sinus tenderness or frontal sinus tenderness.     Mouth/Throat:     Pharynx: No oropharyngeal exudate.  Eyes:     General: No scleral icterus.    Conjunctiva/sclera: Conjunctivae normal.     Pupils: Pupils are equal, round, and reactive to light.  Neck:     Thyroid: No thyromegaly.     Vascular: No carotid bruit or JVD.     Trachea: Trachea normal. No tracheal tenderness or tracheal deviation.  Cardiovascular:     Rate and Rhythm: Normal rate and regular rhythm.     Chest Wall: PMI is not displaced.     Pulses: Normal pulses. No decreased pulses.     Heart sounds: Normal heart sounds, S1 normal and S2 normal. Heart sounds not  distant. No murmur heard. No systolic murmur is present.  No diastolic murmur is present.    No friction rub. No gallop. No S3 or S4 sounds.  Pulmonary:     Effort: No tachypnea, accessory muscle usage or respiratory distress.     Breath sounds: No stridor. No decreased breath sounds, wheezing, rhonchi or rales.  Chest:     Chest wall: No tenderness.  Abdominal:     General: Bowel sounds are normal. There is no distension.     Palpations: Abdomen is  soft. Abdomen is not rigid.     Tenderness: There is no abdominal tenderness. There is no guarding or rebound.  Musculoskeletal:        General: Normal range of motion.     Cervical back: Normal range of motion and neck supple. No edema, erythema or rigidity. No muscular tenderness. Normal range of motion.  Lymphadenopathy:     Head:     Right side of head: No submental or submandibular adenopathy.     Left side of head: No submental or submandibular adenopathy.     Cervical: No cervical adenopathy.  Skin:    General: Skin is warm and dry.     Coloration: Skin is not pale.     Findings: No rash.     Nails: There is no clubbing.  Neurological:     Mental Status: She is alert and oriented to person, place, and time.     Sensory: No sensory deficit.  Psychiatric:        Speech: Speech normal.        Behavior: Behavior normal.     Results for orders placed or performed in visit on 08/24/21  POCT glucose (manual entry)  Result Value Ref Range   POC Glucose 91 70 - 99 mg/dl      The ASCVD Risk score (Arnett DK, et al., 2019) failed to calculate for the following reasons:   The 2019 ASCVD risk score is only valid for ages 26 to 58    Assessment & Plan:   Problem List Items Addressed This Visit       Digestive   Gastroesophageal reflux disease without esophagitis    Improved with pantoprazole we will not make any changes       Relevant Medications   pantoprazole (PROTONIX) 40 MG tablet     Endocrine   Hyperlipidemia due to type 2 diabetes mellitus ([name])    Continue with atorvastatin       Relevant Medications   atorvastatin (LIPITOR) 20 MG tablet   metFORMIN (GLUCOPHAGE-XR) 750 MG 24 hr tablet   New onset type 2 diabetes mellitus ([name]) - Primary    Patient instructed to eat breakfast lunch and dinner on a regular spacing and was given a lifestyle medicine handout The following Lifestyle Medicine recommendations according to [employer] of Lifestyle Medicine  [facility]) were discussed and offered to patient who agrees to start the journey:  A. Whole Foods, Plant-based plate comprising of fruits and vegetables, plant-based proteins, whole-grain carbohydrates was discussed in detail with the patient.   A list for source of those nutrients were also provided to the patient.  Patient will use only water or unsweetened tea for hydration. B.  The need to stay away from risky substances including alcohol, smoking; obtaining 7 to 9 hours of restorative sleep, at least 150 minutes of moderate intensity exercise weekly, the importance of healthy social connections,  and stress reduction techniques were discussed.  Patient asked to  use metformin in the morning with food      Relevant Medications   atorvastatin (LIPITOR) 20 MG tablet   metFORMIN (GLUCOPHAGE-XR) 750 MG 24 hr tablet   TRUEplus Lancets 28G MISC   Other Relevant Orders   POCT glucose (manual entry) (Completed)     Musculoskeletal and Integument   Androgenetic alopecia    As per dermatology we will monitor         Other   Glaucoma of both eyes    Patient has established follow-up with ophthalmology       Microalbuminuria    Monitor       RESOLVED: Numbness and tingling of right arm    This has resolved        Return in about 5 months (around 01/24/2022).    Asencion Noble, MD

## 2021-08-24 NOTE — Patient Instructions (Signed)
No change in medications  Follow lifestyle medicine handout for diet and exercises  Return Dr Joya Gaskins 5 months  Remember to eat regular meals including breakfast  Sin cambios en los medicamentos  Siga el folleto de medicina de estilo de vida para dieta y Actuary Dr. Joya Gaskins 5 meses  Recuerde comer comidas regulares, incluido el desayuno.

## 2021-10-05 ENCOUNTER — Other Ambulatory Visit: Payer: Self-pay

## 2021-11-01 ENCOUNTER — Other Ambulatory Visit: Payer: Self-pay | Admitting: Critical Care Medicine

## 2021-11-01 NOTE — Telephone Encounter (Signed)
Refilled 08/24/2021 #30 [id number] refills. Requested Prescriptions  Pending Prescriptions Disp Refills  . pantoprazole (PROTONIX) 40 MG tablet 30 tablet 3    Sig: Take 1 tablet (40 mg total) by mouth daily before breakfast.     Gastroenterology: Proton Pump Inhibitors Passed - 11/01/2021  5:33 PM      Passed - Valid encounter within last 12 months    Recent Outpatient Visits          2 months ago New onset type 2 diabetes mellitus ([place])   [place] Elsie Stain, MD   6 months ago New onset type 2 diabetes mellitus [facility])   [place] Elsie Stain, MD   11 months ago New onset type 2 diabetes mellitus [facility])   [place] Elsie Stain, MD   1 year ago New onset type 2 diabetes mellitus [facility])   [place] Elsie Stain, MD   1 year ago New onset type 2 diabetes mellitus [facility])   [place], MD      Future Appointments            In 2 months Thereasa Solo, Casimer Bilis Lamoni

## 2021-11-02 ENCOUNTER — Other Ambulatory Visit: Payer: Self-pay

## 2021-11-04 ENCOUNTER — Other Ambulatory Visit: Payer: Self-pay

## 2021-11-30 ENCOUNTER — Other Ambulatory Visit: Payer: Self-pay

## 2021-12-01 ENCOUNTER — Other Ambulatory Visit: Payer: Self-pay

## 2021-12-29 ENCOUNTER — Other Ambulatory Visit: Payer: Self-pay

## 2021-12-29 ENCOUNTER — Other Ambulatory Visit: Payer: Self-pay | Admitting: Critical Care Medicine

## 2021-12-29 MED ORDER — PANTOPRAZOLE SODIUM 40 MG PO TBEC
40.0000 mg | DELAYED_RELEASE_TABLET | Freq: Every day | ORAL | 0 refills | Status: DC
  Filled 2021-12-29: qty 30, 30d supply, fill #0

## 2022-01-24 ENCOUNTER — Ambulatory Visit: Payer: Self-pay | Admitting: Critical Care Medicine

## 2022-01-25 ENCOUNTER — Ambulatory Visit: Payer: Self-pay | Attending: Critical Care Medicine | Admitting: Physician Assistant

## 2022-01-25 ENCOUNTER — Encounter: Payer: Self-pay | Admitting: Physician Assistant

## 2022-01-25 ENCOUNTER — Other Ambulatory Visit: Payer: Self-pay

## 2022-01-25 VITALS — BP 135/82 | HR 62 | Temp 98.1°F | Ht 63.0 in | Wt 189.0 lb

## 2022-01-25 DIAGNOSIS — K219 Gastro-esophageal reflux disease without esophagitis: Secondary | ICD-10-CM

## 2022-01-25 DIAGNOSIS — I7300 Raynaud's syndrome without gangrene: Secondary | ICD-10-CM

## 2022-01-25 DIAGNOSIS — B353 Tinea pedis: Secondary | ICD-10-CM

## 2022-01-25 DIAGNOSIS — E1165 Type 2 diabetes mellitus with hyperglycemia: Secondary | ICD-10-CM

## 2022-01-25 DIAGNOSIS — E1169 Type 2 diabetes mellitus with other specified complication: Secondary | ICD-10-CM

## 2022-01-25 DIAGNOSIS — E785 Hyperlipidemia, unspecified: Secondary | ICD-10-CM

## 2022-01-25 LAB — GLUCOSE, POCT (MANUAL RESULT ENTRY): POC Glucose: 100 mg/dl — AB (ref 70–99)

## 2022-01-25 LAB — POCT GLYCOSYLATED HEMOGLOBIN (HGB A1C): HbA1c, POC (controlled diabetic range): 5.9 % (ref 0.0–7.0)

## 2022-01-25 MED ORDER — AMLODIPINE BESYLATE 5 MG PO TABS
2.5000 mg | ORAL_TABLET | Freq: Every day | ORAL | 1 refills | Status: DC
  Filled 2022-01-25: qty 45, 90d supply, fill #0
  Filled 2022-06-21: qty 45, 90d supply, fill #1

## 2022-01-25 MED ORDER — CLOTRIMAZOLE-BETAMETHASONE 1-0.05 % EX CREA
1.0000 | TOPICAL_CREAM | Freq: Two times a day (BID) | CUTANEOUS | 2 refills | Status: DC
  Filled 2022-01-25: qty 45, 23d supply, fill #0
  Filled 2022-02-12: qty 45, 23d supply, fill #1
  Filled 2022-04-10: qty 45, 23d supply, fill #2

## 2022-01-25 MED ORDER — ATORVASTATIN CALCIUM 20 MG PO TABS
20.0000 mg | ORAL_TABLET | Freq: Every day | ORAL | 2 refills | Status: DC
Start: 2022-01-25 — End: 2022-07-26
  Filled 2022-01-25: qty 90, fill #0
  Filled 2022-04-10: qty 90, 90d supply, fill #0
  Filled 2022-07-22 – 2022-07-24 (×2): qty 90, 90d supply, fill #1

## 2022-01-25 MED ORDER — AMLODIPINE BESYLATE 5 MG PO TABS: 2.5000 mg | ORAL_TABLET | Freq: Every day | ORAL | 1 refills | Status: DC

## 2022-01-25 MED ORDER — PANTOPRAZOLE SODIUM 40 MG PO TBEC
40.0000 mg | DELAYED_RELEASE_TABLET | Freq: Every day | ORAL | 1 refills | Status: DC
  Filled 2022-01-25 – 2022-04-10 (×2): qty 90, 90d supply, fill #0
  Filled 2022-07-22 – 2022-07-24 (×2): qty 90, 90d supply, fill #1

## 2022-01-25 MED ORDER — METFORMIN HCL ER 750 MG PO TB24
750.0000 mg | ORAL_TABLET | Freq: Every day | ORAL | 2 refills | Status: DC
  Filled 2022-01-25: qty 90, 90d supply, fill #0
  Filled 2022-01-28: qty 60, 60d supply, fill #0
  Filled 2022-02-03: qty 30, 30d supply, fill #0
  Filled 2022-03-09: qty 30, 30d supply, fill #1
  Filled 2022-04-10: qty 30, 30d supply, fill #2
  Filled 2022-05-25: qty 30, 30d supply, fill #3
  Filled 2022-06-21: qty 30, 30d supply, fill #4
  Filled 2022-07-22 – 2022-07-24 (×2): qty 30, 30d supply, fill #5

## 2022-01-25 NOTE — Patient Instructions (Addendum)
Pie de atleta Athlete's Foot  El pie de atleta (tinea pedis) es una infeccin por hongos en la piel de los pies. Generalmente se produce en la piel que se encuentra entre los dedos o debajo de ellos. Tambin puede aparecer en la planta de los pies. Los sntomas incluyen picazn o zonas blancas y escamosas en la piel. La infeccin puede transmitirse de Ardelia Mems persona a otra (es contagiosa). Tambin puede propagarse cuando los pies descalzos de una persona entran en contacto con el hongo que est en el piso de la ducha o sobre artculos como zapatos. Siga estas instrucciones en su casa: Medicamentos Aplique o tome los medicamentos de venta libre y los recetados solamente como se lo haya indicado el mdico. Aplique o tome el medicamento antimictico como se lo haya indicado el mdico. No deje de usarlo, aunque comience a sentir que los pies estn mejor. Cuidados de los pies No se rasque los pies. Mantenga los pies secos: Use calcetines de algodn o lana. Cmbiese los calcetines [employer] o si se mojan. Use un tipo de calzado que permita que el aire [place], como sandalias o zapatillas de lona. Lvese y squese los pies: [place] se lo haya indicado el mdico. Despus de hacer actividad fsica. Sin olvidar la zona [employer] dedos. Instrucciones generales No comparta ninguno de los siguientes objetos que entren en contacto con los pies: [place]. Calzado. Alicates para las uas. Otros objetos de [employer]. Use sandalias cuando tenga que pisar zonas hmedas, como vestuarios y duchas compartidas. Concurra a [place]. Si tiene diabetes, mantenga bajo control el nivel de Dispensing optician. Comunquese con un mdico si: Tiene fiebre. Aumenta la hinchazn, el dolor, el calor o el enrojecimiento en el pie. Los pies no mejoran con Dispensing optician. Los sntomas empeoran. Aparecen nuevos sntomas. Siente Geophysical data processor. Resumen El pie de atleta es una  infeccin por hongos en la piel de los pies. Esta afeccin es causada por un hongo que crece en lugares clidos y hmedos. Los sntomas incluyen picazn o zonas blancas y escamosas en la piel. Aplique o tome el medicamento antimictico como se lo haya indicado el mdico. Mantenga los pies limpios y secos. Esta informacin no tiene Marine scientist el consejo del mdico. Asegrese de hacerle al mdico cualquier pregunta que tenga. Document Revised: 07/15/2020 Document Reviewed: 07/15/2020 Elsevier Patient Education  [place] de Raynaud Raynaud's Phenomenon  El fenmeno de Raynaud es una enfermedad que afecta los vasos sanguneos (arterias) que transportan la sangre a los dedos de las manos y de los pies. Las arterias que irrigan sangre a las [place], los labios, los pezones o la punta de la nariz tambin pueden verse afectadas. El fenmeno de Raynaud causa el estrechamiento temporal de las arterias (espasmo). Como consecuencia, la irrigacin de sangre a las zonas afectadas disminuye transitoriamente. Esto suele ocurrir en respuesta a las bajas temperaturas o el estrs. Durante una crisis, la piel de las zonas afectadas se pone blanca, luego azul y finalmente roja. Adems, la persona puede sentir hormigueo o adormecimiento en esas zonas. Generalmente, las crisis [place], y [employer] la irrigacin de sangre a la zona se Education administrator. En la [employer], el fenmeno de Raynaud no causa problemas graves de [place]. Cules son las causas? En muchos casos, se desconoce la causa de esta afeccin. La afeccin puede manifestarse por s sola (fenmeno de Raynaud primario) o puede estar asociada a otras  enfermedades u otros factores (fenmeno de Raynaud secundario). Entre las causas posibles pueden incluirse: Enfermedades o afecciones que [place] arterias. [place] repetitivas que [employer] o los pies. La exposicin a ciertas sustancias qumicas. Tomar  medicamentos que producen el estrechamiento de las arterias. Otros trastornos mdicos, tales como lupus, esclerodermia, artritis reumatoide, problemas de tiroides, trastornos de [employer], sndrome de [place] o IT consultant. Qu incrementa el riesgo? Los siguientes factores pueden hacer que sea ms propenso a desarrollar esta afeccin: Tener entre 17 y 34 aos. Ser mujer. Tener antecedentes familiares del fenmeno de Raynaud. Vivir en un lugar con bajas temperaturas. Fumar. Cules son los signos o sntomas? Los sntomas de fenmeno de Raynaud normalmente se manifiestan cuando una persona se expone a bajas temperaturas o sufre estrs emocional. Pueden durar unos minutos o varias horas. Por lo general, esta enfermedad afecta los dedos de las manos, pero tambin puede [employer] dedos de los pies, los pezones, los labios, las orejas o la punta de la nariz. Entre los sntomas, se pueden incluir los siguientes: Cambios en el color de la piel. La piel de las zonas afectadas se tornar plida o blanca. Luego, puede pasar de blanca a [place] y de [place] a roja, a medida que se restablece la irrigacin normal de sangre a la zona. Adormecimiento, hormigueo o dolor en las zonas afectadas. En los casos graves, los sntomas pueden incluir lo siguiente: Therapist, music piel. Deterioro y [employer] tejidos (gangrena). Cmo se diagnostica? Esta afeccin se puede diagnosticar en funcin de lo siguiente: Los sntomas y los antecedentes mdicos. Un examen fsico. Durante el examen, pueden indicarle que ponga las manos en agua fra para determinar si hay una reaccin a la baja temperatura. Estudios, como, por ejemplo: [place] de sangre para detectar si hay otras enfermedades o afecciones. Un estudio para Careers information officer de la sangre a travs de las arterias y las venas (ecografa vascular). Un estudio mediante el cual la piel de la base de las uas de las manos se examina con un microscopio  (capilaroscopia del lecho ungueal). Cmo se trata? Durante un episodio, puede tomar medidas para ayudar a que los sntomas desaparezcan ms rpido. Las opciones [employer] brazos como la aspas de un molino de viento, calentarse los dedos debajo de agua caliente o poner los dedos en un pliegue clido del cuerpo, como la [place]. El tratamiento a largo plazo para esta afeccin suele implicar hacer cambios en el estilo de vida y tomar medidas para controlar la exposicin a las Higher education careers adviser. En los casos ms graves, pueden usarse medicamentos (antagonistas del calcio) para Engineer, production sangunea. Siga estas instrucciones en su casa: Evite las bajas temperaturas Para evitar la exposicin al fro, siga estos pasos: Si es posible, qudese adentro cuando haga fro. Cuando salga durante la poca de bajas temperaturas, vstase con varias capas de ropa y use mitones, un gorro, una bufanda y zapatos abrigados. Use mitones o guantes cuando manipule hielo o comida congelada. Use portavasos o portalatas para los vasos o las latas que contengan bebidas fras. Deje correr el agua caliente durante un rato antes de tomar una ducha o un bao. Caliente el automvil antes de [employer]. Estilo de vida Si es posible, evite las situaciones emotivas y Haematologist. Trate de encontrar herramientas para controlar el estrs, por ejemplo: Actividad fsica. Yoga. Meditacin. Biorretroalimentacin. No consuma ningn producto que contenga nicotina o tabaco. Estos productos incluyen cigarrillos, tabaco para mascar y aparatos  de vapeo, como los Psychologist, sport and exercise. Si necesita ayuda para dejar de consumir estos productos, consulte al [employer]. Evite ser fumador pasivo. Limite el consumo de cafena. En cambio, tome caf, t y gaseosas descafeinadas. Evite el chocolate. No use herramientas ni maquinaria que vibren. [place] y los pies para no sufrir  lesiones, cortes o moretones. Evite el uso de anillos o pulseras muy ajustados. Use medias flojas y zapatos cmodos y amplios. Use los medicamentos de venta libre y los recetados solamente como se lo haya indicado el mdico. Dnde buscar apoyo Raynaud's Association (Asociacin de Raynaud): www.raynauds.org Dnde buscar ms informacin [employer] of Arthritis and Musculoskeletal and Skin Diseases ([place] Artritis y [place] Musculoesquelticas y Insurance underwriter): www.niams.SouthExposed.es Comunquese con un mdico si: [employer] a pesar de los cambios en el estilo de vida. Le aparecen llagas en los dedos de las manos o de los pies que no se cicatrizan. Tiene estras en la piel de los dedos de las manos o de los pies. Tiene fiebre. Tiene dolor o hinchazn en las articulaciones. Tiene una erupcin cutnea. Los sntomas aparecen en un solo lado del cuerpo. Solicite ayuda de inmediato si: [place] los pies se tornan negros. Tiene dolor intenso en las zonas afectadas. Estos sntomas pueden representar un problema grave que constituye Engineer, maintenance (IT). No espere a ver si los sntomas desaparecen. Solicite atencin mdica de inmediato. Comunquese con el servicio de emergencias de su localidad (911 en los [place]). No conduzca por sus propios medios [employer] Financial. Resumen El fenmeno de Raynaud es una enfermedad que afecta las arterias que transportan la sangre a los dedos de las manos y de los pies, las [place], los labios, los pezones o la punta de la nariz. En muchos casos, se desconoce la causa de esta afeccin. Los sntomas de esta afeccin incluyen cambios en el color de la piel junto con entumecimiento y hormigueo de la zona afectada. El tratamiento de esta enfermedad incluye cambios en el estilo de vida y reduccin de la exposicin a las bajas temperaturas. Pueden usarse medicamentos para los casos graves de Engineer, manufacturing systems. Comunquese  con su mdico si la afeccin empeora aunque reciba tratamiento. Esta informacin no tiene Marine scientist el consejo del mdico. Asegrese de hacerle al mdico cualquier pregunta que tenga. Document Revised: 05/24/2020 Document Reviewed: 05/24/2020 Elsevier Patient Education  [place].

## 2022-01-25 NOTE — Progress Notes (Signed)
Patient ID: Allison Monroe, female   DOB: Feb 27, 1986, 36 y.o.   MRN: [id number]     Allison Monroe, is a 36 y.o. female  BEM:[id number]  [id number]:[id number]  DOB - March 24, 1985  Chief Complaint  Patient presents with   Diabetes    DM f/u.  Having cold feet x2 mo. Waking up with a high blood sugar.  Already received flu vax.       Subjective:   Allison Monroe is a 36 y.o. female here today for med check and cold feet and sometimes hands.  This has been going on for about 2-3 months.  She is having to wear socks 24/7.    Lowest in last 2 weeks  108 and 158 is the highest.   Compliant with meds.  No other new issues or concerns.    No problems updated.  ALLERGIES: No Known Allergies  PAST MEDICAL HISTORY: Past Medical History:  Diagnosis Date   Diabetes mellitus without complication ([place])    Phreesia 04/18/2020   Glaucoma    Phreesia 04/18/2020   HSV (herpes simplex virus) infection 11/23/2020   No pertinent past medical history    Numbness and tingling of right arm 05/31/2020   Tinea pedis 07/19/2020    MEDICATIONS AT HOME: Prior to Admission medications   Medication Sig Start Date End Date Taking? Authorizing Provider  Blood Glucose Monitoring Suppl (TRUE METRIX METER) w/Device KIT Use as directed to check blood sugars 02/04/20  Yes Fulp, Cammie, MD  glucose blood (TRUE METRIX BLOOD GLUCOSE TEST) test strip Use as instructed to initially check blood sugars twice per day- fasting in the morning and before bedtime 08/24/21  Yes Elsie Stain, MD  medroxyPROGESTERone (DEPO-PROVERA) 150 MG/ML injection Inject 150 mg into the muscle every 3 (three) months.    Yes [provider]  Minoxidil (ROGAINE WOMENS) 5 % FOAM Apply 1 mL topically in the morning and at bedtime. 05/26/21  Yes Sharion Settler, DO  TRUEplus Lancets 28G MISC Use to check blood sugar twice a day 08/24/21  Yes Elsie Stain, MD  amLODipine (NORVASC) 5 MG tablet Take 0.5 tablets (2.5 mg  total) by mouth daily. 01/25/22   Argentina Donovan, PA-C  atorvastatin (LIPITOR) 20 MG tablet TAKE 1 TABLET (20 MG TOTAL) BY MOUTH DAILY. 01/25/22 01/25/23  Argentina Donovan, PA-C  metFORMIN (GLUCOPHAGE-XR) 750 MG 24 hr tablet Take 1 tablet (750 mg total) by mouth daily with breakfast. 01/25/22   Argentina Donovan, PA-C  pantoprazole (PROTONIX) 40 MG tablet Take 1 tablet (40 mg total) by mouth daily before breakfast. 01/25/22   Thereasa Solo, Dionne Bucy, PA-C  omeprazole (PRILOSEC) 20 MG capsule Take 1 capsule (20 mg total) by mouth daily. Patient not taking: No sig reported 04/10/18 01/02/20  Wardell Honour, MD    ROS: Neg HEENT Neg resp Neg cardiac Neg GI Neg GU Neg psych Neg neuro  Objective:   Vitals:   01/25/22 0834  BP: 135/82  Pulse: 62  Temp: 98.1 F (36.7 C)  TempSrc: Oral  SpO2: 100%  Weight: 189 lb (85.7 kg)  Height: _0  (1.6 m)   Exam General appearance : Awake, alert, not in any distress. Speech Clear. Not toxic looking HEENT: Atraumatic and Normocephalic Neck: Supple, no JVD. No cervical lymphadenopathy.  Chest: Good air entry bilaterally, CTAB.  No rales/rhonchi/wheezing CVS: S1 S2 regular, no murmurs.  Abdomen: Bowel sounds present, Non tender and not distended with no gaurding, rigidity or rebound. Extremities: B/L Lower Ext shows  no edema, both legs are warm to touch Neurology: Awake alert, and oriented X 3, CN II-XII intact, Non focal Skin: some lesions on feet that appear like tinea.  No cracking or bleeding  Data Review Lab Results  Component Value Date   HGBA1C 5.9 01/25/2022   HGBA1C 6.5 04/25/2021   HGBA1C 6.1 11/23/2020    Assessment & Plan   1. Type 2 diabetes mellitus with hyperglycemia, unspecified whether long term insulin use (HCC) Controlled.  - Glucose (CBG) - HgB A1c - Comprehensive metabolic panel - CBC with Differential/Platelet - Lipid panel - metFORMIN (GLUCOPHAGE-XR) 750 MG 24 hr tablet; Take 1 tablet (750 mg total) by mouth  daily with breakfast.  Dispense: 60 tablet; Refill: 2  2. Hyperlipidemia due to type 2 diabetes mellitus (HCC) - Comprehensive metabolic panel - Lipid panel - atorvastatin (LIPITOR) 20 MG tablet; TAKE 1 TABLET (20 MG TOTAL) BY MOUTH DAILY.  Dispense: 90 tablet; Refill: 2  3. Tinea pedis of both feet Lotrisone sent  4. Raynaud's phenomenon (secondary) Does not seem to be neuropathy given controlled blood sugars and also bc it is not affecting her hands.   - amLODipine (NORVASC) 5 MG tablet; Take 0.5 tablets (2.5 mg total) by mouth daily.  Dispense: 45 tablet; Refill: 1  5. Gastroesophageal reflux disease without esophagitis - pantoprazole (PROTONIX) 40 MG tablet; Take 1 tablet (40 mg total) by mouth daily before breakfast.  Dispense: 90 tablet; Refill: 1    Return in about 6 months (around 07/26/2022), or if symptoms worsen or fail to improve.  The patient was given clear instructions to go to ER or return to medical center if symptoms don't improve, worsen or new problems develop. The patient verbalized understanding. The patient was told to call to get lab results if they haven't heard anything in the next week.      Freeman Caldron, PA-C [facility] [place], [place]   01/25/2022, 8:57 AM

## 2022-01-26 LAB — COMPREHENSIVE METABOLIC PANEL
ALT: 29 IU/L (ref 0–32)
AST: 21 IU/L (ref 0–40)
Albumin/Globulin Ratio: 1.6 (ref 1.2–2.2)
Albumin: 4.5 g/dL (ref 3.9–4.9)
Alkaline Phosphatase: 93 IU/L (ref 44–121)
BUN/Creatinine Ratio: 23 (ref 9–23)
BUN: 19 mg/dL (ref 6–20)
Bilirubin Total: 0.5 mg/dL (ref 0.0–1.2)
CO2: 20 mmol/L (ref 20–29)
Calcium: 9.6 mg/dL (ref 8.7–10.2)
Chloride: 104 mmol/L (ref 96–106)
Creatinine, Ser: 0.81 mg/dL (ref 0.57–1.00)
Globulin, Total: 2.8 g/dL (ref 1.5–4.5)
Glucose: 110 mg/dL — ABNORMAL HIGH (ref 70–99)
Potassium: 4.4 mmol/L (ref 3.5–5.2)
Sodium: 140 mmol/L (ref 134–144)
Total Protein: 7.3 g/dL (ref 6.0–8.5)
eGFR: 96 mL/min/{1.73_m2} (ref >59)

## 2022-01-26 LAB — CBC WITH DIFFERENTIAL/PLATELET
Basophils Absolute: 0 10*3/uL (ref 0.0–0.2)
Basos: 0 %
EOS (ABSOLUTE): 0.1 10*3/uL (ref 0.0–0.4)
Eos: 1 %
Hematocrit: 41.1 % (ref 34.0–46.6)
Hemoglobin: 13.8 g/dL (ref 11.1–15.9)
Immature Grans (Abs): 0 10*3/uL (ref 0.0–0.1)
Immature Granulocytes: 0 %
Lymphocytes Absolute: 3.6 10*3/uL — ABNORMAL HIGH (ref 0.7–3.1)
Lymphs: 42 %
MCH: 29.8 pg (ref 26.6–33.0)
MCHC: 33.6 g/dL (ref 31.5–35.7)
MCV: 89 fL (ref 79–97)
Monocytes Absolute: 0.5 10*3/uL (ref 0.1–0.9)
Monocytes: 5 %
Neutrophils Absolute: 4.5 10*3/uL (ref 1.4–7.0)
Neutrophils: 52 %
Platelets: 340 10*3/uL (ref 150–450)
RBC: 4.63 x10E6/uL (ref 3.77–5.28)
RDW: 12.7 % (ref 11.7–15.4)
WBC: 8.7 10*3/uL (ref 3.4–10.8)

## 2022-01-26 LAB — LIPID PANEL
Chol/HDL Ratio: 3.9 ratio (ref 0.0–4.4)
Cholesterol, Total: 171 mg/dL (ref 100–199)
HDL: 44 mg/dL (ref >39)
LDL Chol Calc (NIH): 110 mg/dL — ABNORMAL HIGH (ref 0–99)
Triglycerides: 91 mg/dL (ref 0–149)
VLDL Cholesterol Cal: 17 mg/dL (ref 5–40)

## 2022-01-30 ENCOUNTER — Other Ambulatory Visit: Payer: Self-pay

## 2022-02-02 ENCOUNTER — Other Ambulatory Visit: Payer: Self-pay

## 2022-02-03 ENCOUNTER — Other Ambulatory Visit: Payer: Self-pay

## 2022-02-06 ENCOUNTER — Other Ambulatory Visit: Payer: Self-pay

## 2022-02-13 ENCOUNTER — Other Ambulatory Visit: Payer: Self-pay

## 2022-03-09 ENCOUNTER — Other Ambulatory Visit: Payer: Self-pay

## 2022-04-11 ENCOUNTER — Other Ambulatory Visit: Payer: Self-pay

## 2022-05-25 ENCOUNTER — Other Ambulatory Visit: Payer: Self-pay

## 2022-06-04 IMAGING — CT CT ABD-PELV W/ CM
2 of 4 series · 16 of 46 positions shown, 18 images · IV contrast (Omni 300)
Comparison: None.

CLINICAL DATA: Abdominal pain, acute, nonlocalized. On [REDACTED] had
some blood and pus coming out of her belly button.

EXAM:
CT ABDOMEN AND PELVIS WITH CONTRAST
TECHNIQUE: Multidetector CT imaging of the abdomen and pelvis was performed
using the standard protocol following bolus administration of
intravenous contrast.
CONTRAST:  100mL OMNIPAQUE IOHEXOL 300 MG/ML  SOLN

[Series 3: a/p w/ 5mm · axial · 0.98mm/px · z∈[+824,+1244]mm · 13 of 94 slices shown, 15 images]
[im 5/94  soft-tissue]
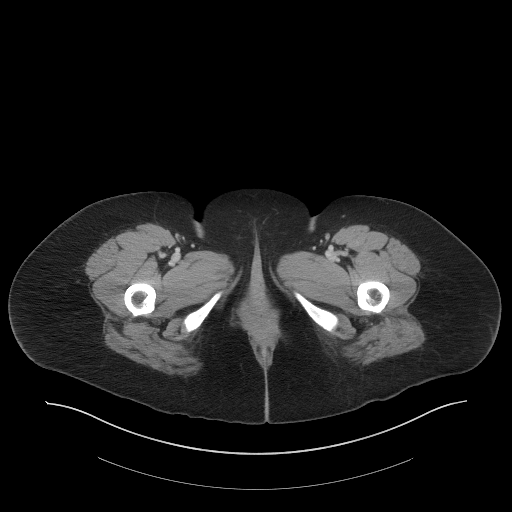
[im 5/94  bone]
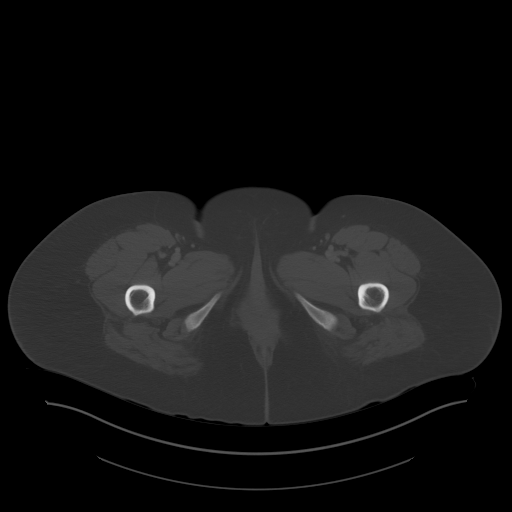
[im 13/94  soft-tissue]
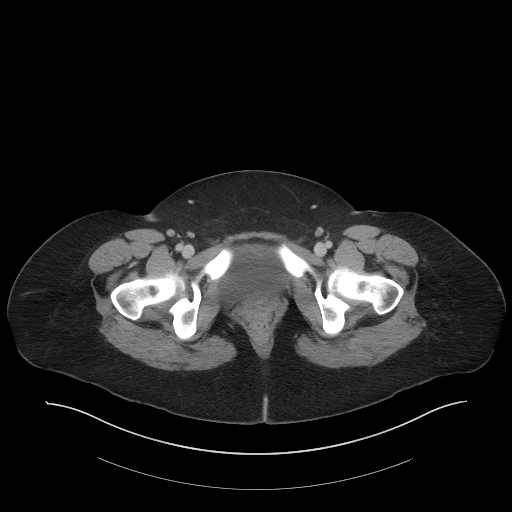
[im 21/94  soft-tissue]
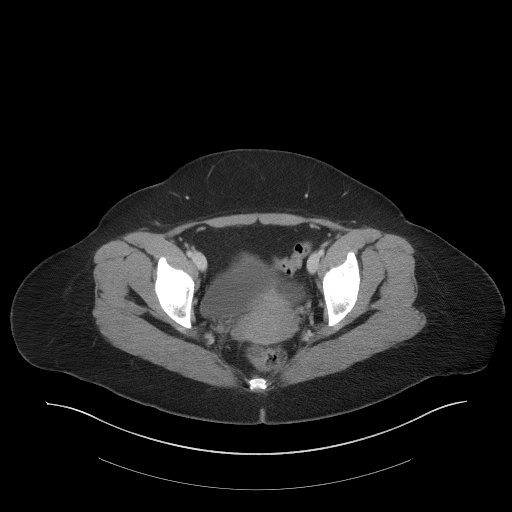
[im 25/94  soft-tissue]
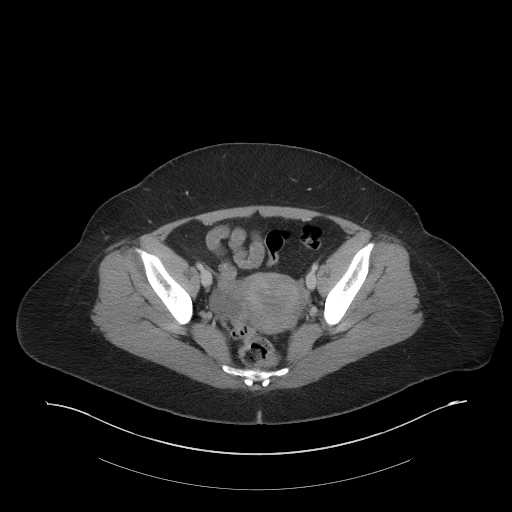
[im 33/94  soft-tissue]
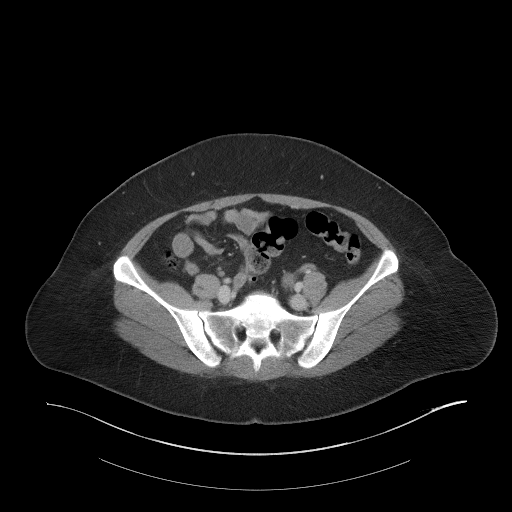
[im 41/94  soft-tissue]
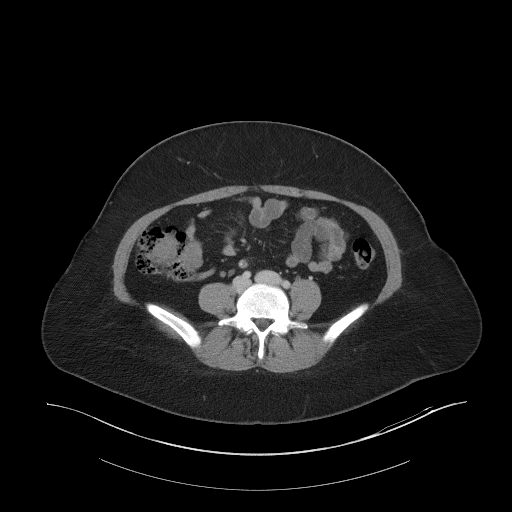
[im 49/94  soft-tissue]
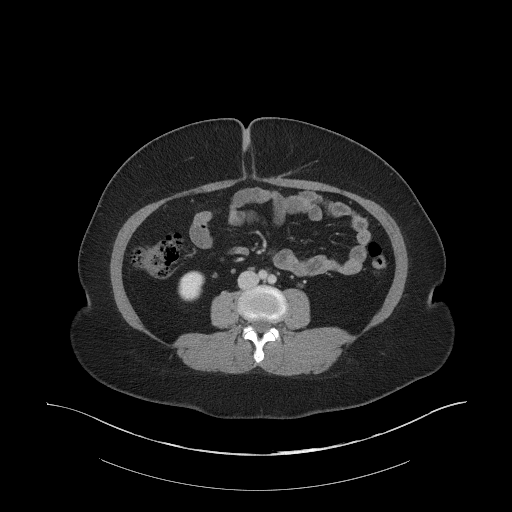
[im 53/94  soft-tissue]
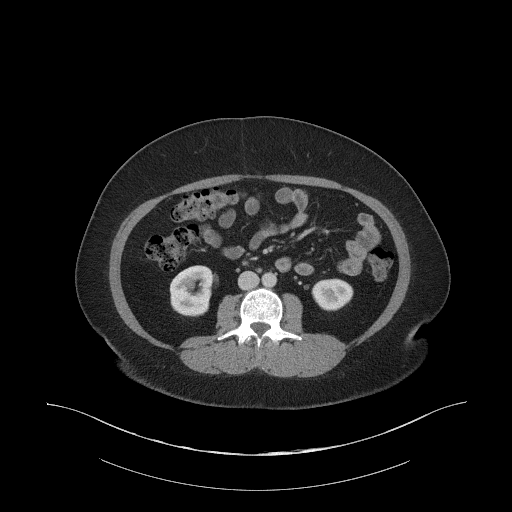
[im 61/94  soft-tissue]
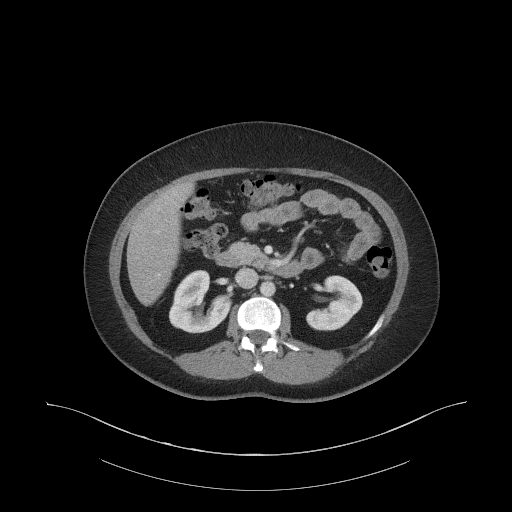
[im 61/94  bone]
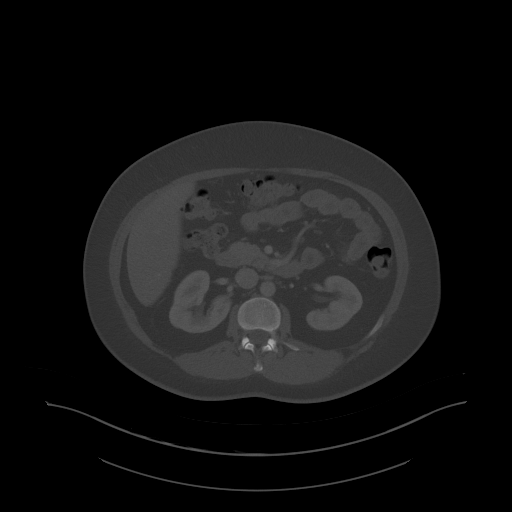
[im 69/94  soft-tissue]
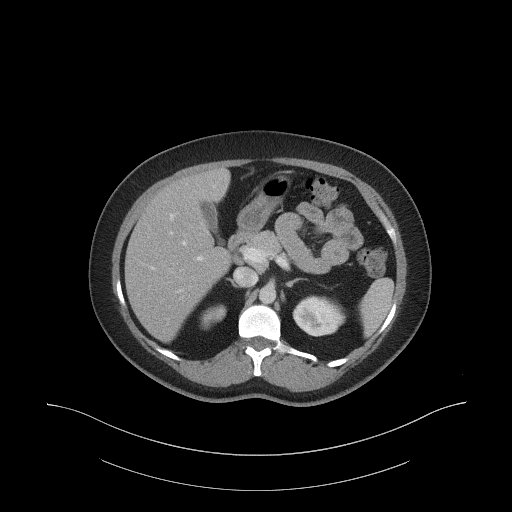
[im 73/94  soft-tissue]
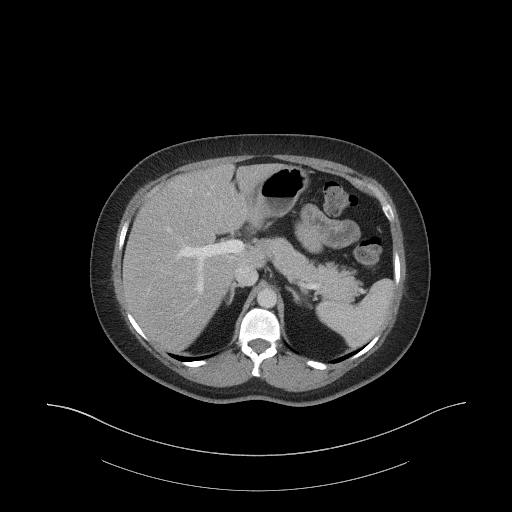
[im 81/94  soft-tissue]
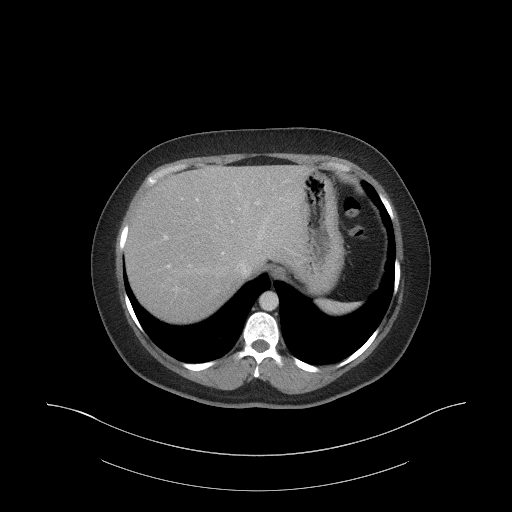
[im 89/94  soft-tissue]
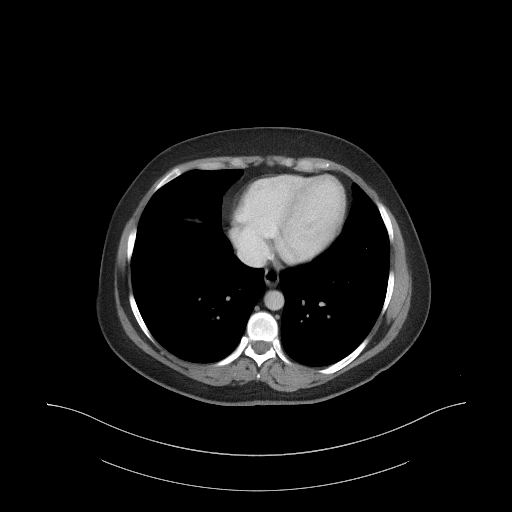

[Series 6: a/p w/ cor · coronal · 0.91mm/px · 3 of 151 slices shown]
[im 51/151  soft-tissue]
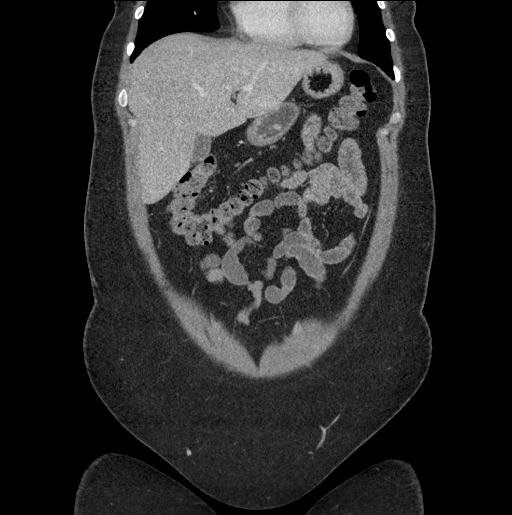
[im 67/151  soft-tissue]
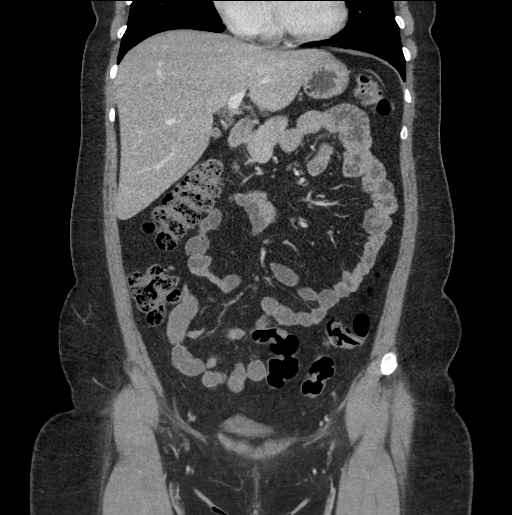
[im 84/151  soft-tissue]
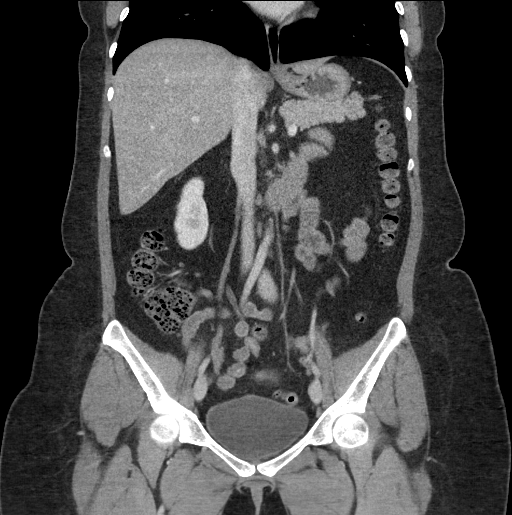

[16 of 46 positions shown; findings below may reference images not displayed]

FINDINGS: Lower chest: Couple scattered calcified granuloma of the lung bases.
No acute abnormality.

Hepatobiliary: No focal liver abnormality. No gallstones,
gallbladder wall thickening, or pericholecystic fluid. No biliary
dilatation.

Pancreas: No focal lesion. Normal pancreatic contour. No surrounding
inflammatory changes. No main pancreatic ductal dilatation.

Spleen: Normal in size without focal abnormality.

Adrenals/Urinary Tract: No adrenal nodule bilaterally. Bilateral
kidneys enhance symmetrically. No hydronephrosis. No hydroureter.
The urinary bladder is unremarkable.

Stomach/Bowel: Stomach is within normal limits. No evidence of bowel
wall thickening or dilatation. Few scattered colonic diverticula.
Appendix appears normal.

Vascular/Lymphatic: No significant vascular findings are present. No
definite enlarged abdominal or pelvic lymph nodes.

Reproductive: Uterus and bilateral adnexa are unremarkable.

Other: No intraperitoneal free fluid. No intraperitoneal free gas.
No organized fluid collection.

Musculoskeletal:

Tiny fat containing umbilical hernia with an abdominal defect of
cm. Nonspecific subcentimeter severe soft tissue densities along the
anterior abdominal wall open ([DATE], 51). No organized fluid
collection along the paraumbilical region. No associated fat
stranding.

No suspicious lytic or blastic osseous lesions. No acute displaced
fracture.
IMPRESSION: 1. Indeterminate 1.3 x 1.8 cm soft tissue density lesion anterior to
the superior pole the left kidney of unclear etiology. Finding could
be related to the kidney or adrenal gland, versus represent a lymph
node. Recommend renal protocol MRI for further evaluation.
2. Tiny fat containing umbilical hernia with an abdominal wall
defect of 0.7 cm. No associated organized fluid collection or
findings suggestive of inflammation.

## 2022-06-21 ENCOUNTER — Other Ambulatory Visit: Payer: Self-pay

## 2022-06-22 ENCOUNTER — Other Ambulatory Visit: Payer: Self-pay

## 2022-07-24 ENCOUNTER — Other Ambulatory Visit: Payer: Self-pay

## 2022-07-26 ENCOUNTER — Other Ambulatory Visit: Payer: Self-pay | Admitting: Critical Care Medicine

## 2022-07-26 ENCOUNTER — Other Ambulatory Visit: Payer: Self-pay

## 2022-07-26 ENCOUNTER — Encounter: Payer: Self-pay | Admitting: Critical Care Medicine

## 2022-07-26 ENCOUNTER — Ambulatory Visit: Payer: Self-pay | Attending: Critical Care Medicine | Admitting: Critical Care Medicine

## 2022-07-26 VITALS — BP 127/84 | HR 61 | Temp 98.5°F | Ht 63.0 in | Wt 194.0 lb

## 2022-07-26 DIAGNOSIS — E119 Type 2 diabetes mellitus without complications: Secondary | ICD-10-CM

## 2022-07-26 DIAGNOSIS — L649 Androgenic alopecia, unspecified: Secondary | ICD-10-CM

## 2022-07-26 DIAGNOSIS — R809 Proteinuria, unspecified: Secondary | ICD-10-CM

## 2022-07-26 DIAGNOSIS — Z7984 Long term (current) use of oral hypoglycemic drugs: Secondary | ICD-10-CM

## 2022-07-26 DIAGNOSIS — E1169 Type 2 diabetes mellitus with other specified complication: Secondary | ICD-10-CM

## 2022-07-26 DIAGNOSIS — E785 Hyperlipidemia, unspecified: Secondary | ICD-10-CM

## 2022-07-26 DIAGNOSIS — E1165 Type 2 diabetes mellitus with hyperglycemia: Secondary | ICD-10-CM

## 2022-07-26 DIAGNOSIS — K219 Gastro-esophageal reflux disease without esophagitis: Secondary | ICD-10-CM

## 2022-07-26 DIAGNOSIS — I7300 Raynaud's syndrome without gangrene: Secondary | ICD-10-CM

## 2022-07-26 DIAGNOSIS — H409 Unspecified glaucoma: Secondary | ICD-10-CM

## 2022-07-26 LAB — POCT GLYCOSYLATED HEMOGLOBIN (HGB A1C): HbA1c, POC (controlled diabetic range): 6.3 % (ref 0.0–7.0)

## 2022-07-26 LAB — GLUCOSE, POCT (MANUAL RESULT ENTRY): POC Glucose: 133 mg/dl — AB (ref 70–99)

## 2022-07-26 MED ORDER — METFORMIN HCL ER 750 MG PO TB24
750.0000 mg | ORAL_TABLET | Freq: Every day | ORAL | 2 refills | Status: DC
  Filled 2022-08-30: qty 90, 90d supply, fill #0
  Filled 2022-11-28 (×2): qty 90, 90d supply, fill #1

## 2022-07-26 MED ORDER — ATORVASTATIN CALCIUM 20 MG PO TABS
20.0000 mg | ORAL_TABLET | Freq: Every day | ORAL | 2 refills | Status: DC
  Filled 2022-11-28: qty 90, 90d supply, fill #0

## 2022-07-26 MED ORDER — PANTOPRAZOLE SODIUM 40 MG PO TBEC
40.0000 mg | DELAYED_RELEASE_TABLET | Freq: Every day | ORAL | 1 refills | Status: DC
  Filled 2022-11-28: qty 90, 90d supply, fill #0

## 2022-07-26 MED ORDER — AMLODIPINE BESYLATE 5 MG PO TABS
2.5000 mg | ORAL_TABLET | Freq: Every day | ORAL | 1 refills | Status: DC
Start: 2022-07-26 — End: 2023-01-27
  Filled 2022-07-26: qty 45, 90d supply, fill #0

## 2022-07-26 NOTE — Assessment & Plan Note (Signed)
Continue statin. 

## 2022-07-26 NOTE — Patient Instructions (Addendum)
Medication refill sent to our pharmacy downstairs  We will obtain your eye exam report  Pap smear will be scheduled with one of our providers  Labs today will be a metabolic panel cholesterol and urine for protein  Discontinue the Rogaine foam  Return to see Dr. Delford Field 6 months  Reabastecimiento de medicamentos enviado a nuestra farmacia de abajo  Obtendremos el informe de su examen de la vista.  La prueba de Papanicolaou se programar con uno de nuestros proveedores.  Los laboratorios hoy realizarn un panel metablico de colesterol y [place] para protenas.  Suspender la espuma Rogaine  Volver a ver al Dr. Delford Field 6 meses Open in [employer]

## 2022-07-26 NOTE — Assessment & Plan Note (Signed)
Maintain pantoprazole

## 2022-07-26 NOTE — Assessment & Plan Note (Signed)
Reassess urine albumin 

## 2022-07-26 NOTE — Assessment & Plan Note (Signed)
Discontinue Rogaine observe

## 2022-07-26 NOTE — Progress Notes (Signed)
Established Patient Office Visit  Subjective   Patient ID: Allison Monroe, female    DOB: 11-19-1985  Age: 37 y.o. MRN: [id number]  Chief Complaint  Patient presents with   Diabetes    DM f/u. No questions / concerns     08/2021 Patient seen in return follow-up for primary care from gastroenterology perspective she is doing well she is on the Protonix and following a reflux diet her abdominal pain has resolved.  Patient continues to receive Depo-Provera monthly per gynecology.  Patient states her blood sugars however been running high in the mornings in the 140s to 160s.  In the afternoons are in the 100-1 20 range.  She has been skipping breakfast and taking the metformin at lunch.  She sometimes skips dinner.  She occasionally has spells of dizziness and shakiness late in the day.  After she eats this resolves.  She is trying to eat more healthy at this time.  Blood sugar on arrival today is 91 blood pressure 108/72  07/26/22 Patient seen in return follow-up for diabetes.  She also had foot fungus as well.  She is responded to the foot cream.  She did not respond to the Rogaine foam with her alopecia this has been discontinued.  She had an eye exam recently will obtain the report from [facility].  She needs a Pap smear.  Lipids need to be followed up.  On arrival A1c is at goal    Review of Systems  Constitutional:  Negative for chills, diaphoresis, fever, malaise/fatigue and weight loss.  HENT:  Negative for congestion, ear discharge, ear pain, hearing loss, nosebleeds, sore throat and tinnitus.   Eyes:  Negative for blurred vision, double vision, photophobia and discharge.  Respiratory:  Negative for cough, hemoptysis, sputum production, shortness of breath, wheezing and stridor.        No excess mucus  Cardiovascular:  Negative for chest pain, palpitations, orthopnea, claudication, leg swelling and PND.  Gastrointestinal:  Negative for abdominal pain, blood in stool,  constipation, diarrhea, heartburn, melena, nausea and vomiting.  Genitourinary:  Negative for dysuria, flank pain, frequency, hematuria and urgency.  Musculoskeletal:  Negative for back pain, falls, joint pain, myalgias and neck pain.  Skin:  Negative for itching and rash.  Neurological:  Negative for dizziness, tingling, tremors, sensory change, speech change, focal weakness, seizures, loss of consciousness, weakness and headaches.  Endo/Heme/Allergies:  Negative for environmental allergies and polydipsia. Does not bruise/bleed easily.  Psychiatric/Behavioral:  Negative for depression, hallucinations, memory loss, substance abuse and suicidal ideas. The patient is not nervous/anxious and does not have insomnia.   All other systems reviewed and are negative.     Objective:     BP 127/84 (BP Location: Left Arm, Patient Position: Sitting, Cuff Size: Normal)   Pulse 61   Temp 98.5 F (36.9 C) (Oral)   Ht 5\' 3"  (1.6 m)   Wt 194 lb (88 kg)   SpO2 100%   BMI 34.37 kg/m    Physical Exam Vitals reviewed.  Constitutional:      Appearance: Normal appearance. She is well-developed. She is obese. She is not diaphoretic.  HENT:     Head: Normocephalic and atraumatic.     Nose: No nasal deformity, septal deviation, mucosal edema or rhinorrhea.     Right Sinus: No maxillary sinus tenderness or frontal sinus tenderness.     Left Sinus: No maxillary sinus tenderness or frontal sinus tenderness.     Mouth/Throat:     Pharynx:  No oropharyngeal exudate.  Eyes:     General: No scleral icterus.    Conjunctiva/sclera: Conjunctivae normal.     Pupils: Pupils are equal, round, and reactive to light.  Neck:     Thyroid: No thyromegaly.     Vascular: No carotid bruit or JVD.     Trachea: Trachea normal. No tracheal tenderness or tracheal deviation.  Cardiovascular:     Rate and Rhythm: Normal rate and regular rhythm.     Chest Wall: PMI is not displaced.     Pulses: Normal pulses. No decreased  pulses.     Heart sounds: Normal heart sounds, S1 normal and S2 normal. Heart sounds not distant. No murmur heard.    No systolic murmur is present.     No diastolic murmur is present.     No friction rub. No gallop. No S3 or S4 sounds.  Pulmonary:     Effort: No tachypnea, accessory muscle usage or respiratory distress.     Breath sounds: No stridor. No decreased breath sounds, wheezing, rhonchi or rales.  Chest:     Chest wall: No tenderness.  Abdominal:     General: Bowel sounds are normal. There is no distension.     Palpations: Abdomen is soft. Abdomen is not rigid.     Tenderness: There is no abdominal tenderness. There is no guarding or rebound.  Musculoskeletal:        General: Normal range of motion.     Cervical back: Normal range of motion and neck supple. No edema, erythema or rigidity. No muscular tenderness. Normal range of motion.     Comments: Foot exam normal  Lymphadenopathy:     Head:     Right side of head: No submental or submandibular adenopathy.     Left side of head: No submental or submandibular adenopathy.     Cervical: No cervical adenopathy.  Skin:    General: Skin is warm and dry.     Coloration: Skin is not pale.     Findings: No rash.     Nails: There is no clubbing.  Neurological:     Mental Status: She is alert and oriented to person, place, and time.     Sensory: No sensory deficit.  Psychiatric:        Speech: Speech normal.        Behavior: Behavior normal.      Results for orders placed or performed in visit on 07/26/22  HgB A1c  Result Value Ref Range   Hemoglobin A1C     HbA1c POC (<> result, manual entry)     HbA1c, POC (prediabetic range)     HbA1c, POC (controlled diabetic range) 6.3 0.0 - 7.0 %  POCT glucose (manual entry)  Result Value Ref Range   POC Glucose 133 (A) 70 - 99 mg/dl      The ASCVD Risk score (Arnett DK, et al., 2019) failed to calculate for the following reasons:   The 2019 ASCVD risk score is only valid  for ages 2 to 69    Assessment & Plan:   Problem List Items Addressed This Visit       Digestive   Gastroesophageal reflux disease without esophagitis    Maintain pantoprazole      Relevant Medications   pantoprazole (PROTONIX) 40 MG tablet     Endocrine   Hyperlipidemia due to type 2 diabetes mellitus (HCC)    Continue statin      Relevant Medications   amLODipine (  NORVASC) 5 MG tablet   atorvastatin (LIPITOR) 20 MG tablet   metFORMIN (GLUCOPHAGE-XR) 750 MG 24 hr tablet   Other Relevant Orders   Lipid panel   New onset type 2 diabetes mellitus (HCC)    At goal maintain metformin foot exam normal      Relevant Medications   atorvastatin (LIPITOR) 20 MG tablet   metFORMIN (GLUCOPHAGE-XR) 750 MG 24 hr tablet     Musculoskeletal and Integument   Androgenetic alopecia    Discontinue Rogaine observe        Other   Glaucoma of both eyes    Obtain eye exam from [facility]      Relevant Medications   timolol (TIMOPTIC) 0.5 % ophthalmic solution   Microalbuminuria    Reassess urine albumin      Other Visit Diagnoses     Type 2 diabetes mellitus with hyperglycemia, unspecified whether long term insulin use (HCC)    -  Primary   Relevant Medications   atorvastatin (LIPITOR) 20 MG tablet   metFORMIN (GLUCOPHAGE-XR) 750 MG 24 hr tablet   Other Relevant Orders   Urine microalbumin-creatinine with uACR   Renal function panel with eGFR   HgB A1c (Completed)   POCT glucose (manual entry) (Completed)   Raynaud's phenomenon (secondary)       Relevant Medications   amLODipine (NORVASC) 5 MG tablet      Return in about 6 months (around 01/26/2023) for diabetes.    Shan Levans, MD

## 2022-07-26 NOTE — Assessment & Plan Note (Signed)
At goal maintain metformin foot exam normal

## 2022-07-26 NOTE — Assessment & Plan Note (Signed)
Obtain eye exam from [facility]

## 2022-07-27 LAB — RENAL FUNCTION PANEL
Albumin: 4.4 g/dL (ref 3.9–4.9)
BUN/Creatinine Ratio: 15 (ref 9–23)
BUN: 13 mg/dL (ref 6–20)
CO2: 20 mmol/L (ref 20–29)
Calcium: 10 mg/dL (ref 8.7–10.2)
Chloride: 104 mmol/L (ref 96–106)
Creatinine, Ser: 0.88 mg/dL (ref 0.57–1.00)
Glucose: 118 mg/dL — ABNORMAL HIGH (ref 70–99)
Phosphorus: 3.9 mg/dL (ref 3.0–4.3)
Potassium: 4.4 mmol/L (ref 3.5–5.2)
Sodium: 139 mmol/L (ref 134–144)
eGFR: 86 mL/min/{1.73_m2} (ref >59)

## 2022-07-27 LAB — LIPID PANEL
Chol/HDL Ratio: 3.6 ratio (ref 0.0–4.4)
Cholesterol, Total: 152 mg/dL (ref 100–199)
HDL: 42 mg/dL (ref >39)
LDL Chol Calc (NIH): 88 mg/dL (ref 0–99)
Triglycerides: 120 mg/dL (ref 0–149)
VLDL Cholesterol Cal: 22 mg/dL (ref 5–40)

## 2022-07-27 LAB — MICROALBUMIN / CREATININE URINE RATIO
Creatinine, Urine: 193.5 mg/dL
Creatinine, Urine: 188.5 mg/dL
Microalb/Creat Ratio: 299 mg/g creat — ABNORMAL HIGH (ref 0–29)
Microalb/Creat Ratio: 305 mg/g creat — ABNORMAL HIGH (ref 0–29)
Microalbumin, Urine: 578.2 ug/mL
Microalbumin, Urine: 574.9 ug/mL

## 2022-07-28 NOTE — Progress Notes (Signed)
Let pt know there is protein in urine from diabetes continue to medications as prescribed

## 2022-08-30 ENCOUNTER — Other Ambulatory Visit: Payer: Self-pay

## 2022-08-31 ENCOUNTER — Encounter: Payer: Self-pay | Admitting: Physician Assistant

## 2022-08-31 ENCOUNTER — Ambulatory Visit: Payer: Self-pay | Attending: Physician Assistant | Admitting: Physician Assistant

## 2022-08-31 ENCOUNTER — Other Ambulatory Visit (HOSPITAL_COMMUNITY)
Admission: RE | Admit: 2022-08-31 | Discharge: 2022-08-31 | Disposition: A | Discharge status: Home / Self Care | Payer: Self-pay | Source: Ambulatory Visit | Attending: Physician Assistant | Admitting: Physician Assistant

## 2022-08-31 VITALS — BP 121/83 | HR 56 | Ht 63.5 in | Wt 195.4 lb

## 2022-08-31 DIAGNOSIS — Z7984 Long term (current) use of oral hypoglycemic drugs: Secondary | ICD-10-CM

## 2022-08-31 DIAGNOSIS — N3943 Post-void dribbling: Secondary | ICD-10-CM

## 2022-08-31 DIAGNOSIS — E1165 Type 2 diabetes mellitus with hyperglycemia: Secondary | ICD-10-CM

## 2022-08-31 DIAGNOSIS — Z124 Encounter for screening for malignant neoplasm of cervix: Secondary | ICD-10-CM | POA: Insufficient documentation

## 2022-08-31 LAB — GLUCOSE, POCT (MANUAL RESULT ENTRY): POC Glucose: 129 mg/dl — AB (ref 70–99)

## 2022-08-31 NOTE — Patient Instructions (Signed)
Pap Test Why am I having this test? A Pap test, also called a Pap smear, is a screening test to check for signs of: Infection. Cancer of the cervix. The cervix is the lower part of the uterus that opens into the vagina. Changes that may be a sign that cancer is developing (precancerous changes). Women need this test on a regular basis. In general, you should have a Pap test every 3 years until you reach menopause or age 38. Women aged 30-60 may choose to have their Pap test done at the same time as an HPV (human papillomavirus) test every 5 years (instead of every 3 years). Your health care provider may recommend having Pap tests more or less often depending on your medical conditions and past Pap test results. What is being tested? Cervical cells are tested for signs of infection or abnormalities. What kind of sample is taken?  Your health care provider will collect a sample of cells from the surface of your cervix. This will be done using a small cotton swab, plastic spatula, or brush that is inserted into your vagina using a tool called a speculum. This sample is often collected during a pelvic exam, when you are lying on your back on an exam table with your feet in footrests (stirrups). In some cases, fluids (secretions) from the cervix or vagina may also be collected. How do I prepare for this test? Be aware of where you are in your menstrual cycle. If you are menstruating on the day of the test, you may be asked to reschedule. You may need to reschedule if you have a known vaginal infection on the day of the test. Follow instructions from your health care provider about: Changing or stopping your regular medicines. Some medicines can cause abnormal test results, such as vaginal medicines and tetracycline. Avoiding douching 2-3 days before or the day of the test. Tell a health care provider about: Any allergies you have. All medicines you are taking, including vitamins, herbs, eye drops,  creams, and over-the-counter medicines. Any bleeding problems you have. Any surgeries you have had. Any medical conditions you have. Whether you are pregnant or may be pregnant. How are the results reported? Your test results will be reported as either abnormal or normal. What do the results mean? A normal test result means that you do not have signs of cancer of the cervix. An abnormal result may mean that you have: Cancer. A Pap test by itself is not enough to diagnose cancer. You will have more tests done if cancer is suspected. Precancerous changes in your cervix. Inflammation of the cervix. An STI (sexually transmitted infection). A fungal infection. A parasite infection. Talk with your health care provider about what your results mean. In some cases, your health care provider may do more testing to confirm the results. Questions to ask your health care provider Ask your health care provider, or the department that is doing the test: When will my results be ready? How will I get my results? What are my treatment options? What other tests do I need? What are my next steps? Summary In general, women should have a Pap test every 3 years until they reach menopause or age 38. Your health care provider will collect a sample of cells from the surface of your cervix. This will be done using a small cotton swab, plastic spatula, or brush. In some cases, fluids (secretions) from the cervix or vagina may also be collected. This information is not   intended to replace advice given to you by your health care provider. Make sure you discuss any questions you have with your health care provider. Document Revised: 06/04/2020 Document Reviewed: 06/04/2020 Elsevier Patient Education  2024 Elsevier Inc.  

## 2022-08-31 NOTE — Progress Notes (Signed)
Patient ID: Allison Monroe, female   DOB: 1985/05/04, 37 y.o.   MRN: [id number]   Allison Monroe, is a 37 y.o. female  [id number]  [id number]  DOB - 1985-09-12  Chief Complaint  Patient presents with   Gynecologic Exam       Subjective:   Allison Monroe is a 36 y.o. female here today for pap.  No vaginal discharge.  Denies STD risk factors and declines testing.  On Depo.  Not having periods.    She had a slightly abnormal pap several years ago and normal since.  No procedures were done  After pap had been completed she mentions Still feels need to urinate sometimes after urinating.  This has been going on for many months.  No dysuria.    No problems updated.  ALLERGIES: No Known Allergies  PAST MEDICAL HISTORY: Past Medical History:  Diagnosis Date   Diabetes mellitus without complication (HCC)    Phreesia 04/18/2020   Glaucoma    Phreesia 04/18/2020   HSV (herpes simplex virus) infection 11/23/2020   No pertinent past medical history    Numbness and tingling of right arm 05/31/2020   Tinea pedis 07/19/2020    MEDICATIONS AT HOME: Prior to Admission medications   Medication Sig Start Date End Date Taking? Authorizing Provider  amLODipine (NORVASC) 5 MG tablet Take 0.5 tablets (2.5 mg total) by mouth daily. 07/26/22  Yes Storm Frisk, MD  atorvastatin (LIPITOR) 20 MG tablet Take 1 tablet (20 mg total) by mouth daily. 07/26/22  Yes Storm Frisk, MD  Blood Glucose Monitoring Suppl (TRUE METRIX METER) w/Device KIT Use as directed to check blood sugars 02/04/20  Yes Fulp, Cammie, MD  glucose blood (TRUE METRIX BLOOD GLUCOSE TEST) test strip Use as instructed to initially check blood sugars twice per day- fasting in the morning and before bedtime 08/24/21  Yes Storm Frisk, MD  medroxyPROGESTERone (DEPO-PROVERA) 150 MG/ML injection Inject 150 mg into the muscle every 3 (three) months.    Yes [provider]  metFORMIN (GLUCOPHAGE-XR) 750  MG 24 hr tablet Take 1 tablet (750 mg total) by mouth daily with breakfast. 07/26/22  Yes Storm Frisk, MD  pantoprazole (PROTONIX) 40 MG tablet Take 1 tablet (40 mg total) by mouth daily before breakfast. 07/26/22  Yes Storm Frisk, MD  timolol (TIMOPTIC) 0.5 % ophthalmic solution 1 drop 2 (two) times daily. 02/01/22  Yes [provider]  TRUEplus Lancets 28G MISC Use to check blood sugar twice a day 08/24/21  Yes Storm Frisk, MD  omeprazole (PRILOSEC) 20 MG capsule Take 1 capsule (20 mg total) by mouth daily. Patient not taking: No sig reported 04/10/18 01/02/20  Frederica Kuster, MD    ROS: Neg HEENT Neg resp Neg cardiac Neg GI Neg MS Neg psych Neg neuro  Objective:   Vitals:   08/31/22 0917  BP: 121/83  Pulse: (!) 56  SpO2: 99%  Weight: 195 lb 6.4 oz (88.6 kg)  Height: 5' 3.5" (1.613 m)   Exam General appearance : Awake, alert, not in any distress. Speech Clear. Not toxic looking HEENT: Atraumatic and Normocephalic Neck: Supple, no JVD. No cervical lymphadenopathy.  Chest: Good air entry bilaterally, CTAB.  No rales/rhonchi/wheezing CVS: S1 S2 regular, no murmurs.  GU:  external genitalia WNL.  Speculum inserted.  Cervix friable but appears otherwise normal.  Bimanual unremarkable Extremities: B/L Lower Ext shows no edema, both legs are warm to touch Neurology: Awake alert, and oriented X 3, CN  II-XII intact, Non focal Skin: No Rash  Data Review Lab Results  Component Value Date   HGBA1C 6.3 07/26/2022   HGBA1C 5.9 01/25/2022   HGBA1C 6.5 04/25/2021    Assessment & Plan   1. Type 2 diabetes mellitus with hyperglycemia, unspecified whether long term insulin use (HCC) Blood sugar good today - Glucose (CBG)  2. Post-void dribbling Still feels need to urinate sometimes after urinating.   - POCT URINALYSIS DIP (CLINITEK)  3. Screening for cervical cancer - Cytology - PAP([facility])    Return for keep nect appt with PCP in  November.  The patient was given clear instructions to go to ER or return to medical center if symptoms don't improve, worsen or new problems develop. The patient verbalized understanding. The patient was told to call to get lab results if they haven't heard anything in the next week.      Georgian Co, PA-C [facility] [place], [place] [phone]   08/31/2022, 9:41 AM

## 2022-09-01 LAB — POCT URINALYSIS DIP (CLINITEK)
Bilirubin, UA: NEGATIVE
Glucose, UA: NEGATIVE mg/dL
Ketones, POC UA: NEGATIVE mg/dL
Leukocytes, UA: NEGATIVE
Nitrite, UA: NEGATIVE
POC PROTEIN,UA: >=300 — AB
Spec Grav, UA: >=1.03 — AB (ref 1.010–1.025)
Urobilinogen, UA: 0.2 E.U./dL
pH, UA: 5.5 (ref 5.0–8.0)

## 2022-09-04 LAB — CYTOLOGY - PAP
Comment: NEGATIVE
Diagnosis: NEGATIVE
High risk HPV: NEGATIVE

## 2022-11-28 ENCOUNTER — Other Ambulatory Visit: Payer: Self-pay

## 2022-11-29 ENCOUNTER — Other Ambulatory Visit: Payer: Self-pay

## 2023-01-27 ENCOUNTER — Ambulatory Visit (HOSPITAL_COMMUNITY)
Admission: EM | Admit: 2023-01-27 | Discharge: 2023-01-27 | Disposition: A | Discharge status: Home / Self Care | Payer: Self-pay | Attending: Emergency Medicine | Admitting: Emergency Medicine

## 2023-01-27 ENCOUNTER — Encounter (HOSPITAL_COMMUNITY): Payer: Self-pay

## 2023-01-27 DIAGNOSIS — N760 Acute vaginitis: Secondary | ICD-10-CM | POA: Insufficient documentation

## 2023-01-27 DIAGNOSIS — R350 Frequency of micturition: Secondary | ICD-10-CM | POA: Insufficient documentation

## 2023-01-27 LAB — POCT URINALYSIS DIP (MANUAL ENTRY)
Bilirubin, UA: NEGATIVE
Glucose, UA: 100 mg/dL — AB
Leukocytes, UA: NEGATIVE
Nitrite, UA: NEGATIVE
Protein Ur, POC: 100 mg/dL — AB
Spec Grav, UA: >=1.03 — AB (ref 1.010–1.025)
Urobilinogen, UA: 0.2 U/dL
pH, UA: 5.5 (ref 5.0–8.0)

## 2023-01-27 LAB — POCT FASTING CBG KUC MANUAL ENTRY: POCT Glucose (KUC): 161 mg/dL — AB (ref 70–99)

## 2023-01-27 MED ORDER — FLUCONAZOLE 150 MG PO TABS: 150.0000 mg | ORAL_TABLET | Freq: Every day | ORAL | 0 refills | Status: DC

## 2023-01-27 NOTE — ED Provider Notes (Signed)
[facility]    CSN: [id number] Arrival date & time: 01/27/23  1219      History   Chief Complaint Chief Complaint  Patient presents with   Urinary Frequency    HPI Allison Monroe is a 37 y.o. female.   Patient presents to clinic complaining of urinary frequency, vaginal itching and overall vaginal discomfort that started on October 27.  When her symptoms started she used Monistat over-the-counter and Azo for the first week without much relief.  She went to the health department on Monday where they tested her urine and gave her 3 days of Bactrim.  She completed these without much improvement in her symptoms.  Continues to have frequent urination, itching and a burning sensation.  Denies any changes to her vaginal discharge, no vaginal odor.  She is sexually active.  Reports she initially had low back pain and suprapubic pressure which improved with the antibiotics.  No fevers, nausea, vomiting or back pain currently.  She is a type II diabetic and takes her metformin daily.  She has not yet taken it today, she likes to take it after her meal.  She did check her blood sugar after lunch yesterday and it was in the 180s.  The history is provided by the patient and medical records.  Urinary Frequency    Past Medical History:  Diagnosis Date   Diabetes mellitus without complication (HCC)    Phreesia 04/18/2020   Glaucoma    Phreesia 04/18/2020   HSV (herpes simplex virus) infection 11/23/2020   No pertinent past medical history    Numbness and tingling of right arm 05/31/2020   Tinea pedis 07/19/2020    Patient Active Problem List   Diagnosis Date Noted   Contact dermatitis 05/26/2021   Microalbuminuria 04/26/2021   Gastroesophageal reflux disease without esophagitis 04/25/2021   Androgenetic alopecia 05/31/2020   Glaucoma of both eyes 03/08/2020   New onset type 2 diabetes mellitus (HCC) 03/08/2020   Umbilical hernia 03/08/2020   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 Authorizing Provider  fluconazole (DIFLUCAN) 150 MG tablet Take 1 tablet (150 mg total) by mouth daily. 01/27/23  Yes Rinaldo Ratel, [place] N, FNP  atorvastatin (LIPITOR) 20 MG tablet Take 1 tablet (20 mg total) by mouth daily. 07/26/22   Storm Frisk, MD  Blood Glucose Monitoring Suppl (TRUE METRIX METER) w/Device KIT Use as directed to check blood sugars 02/04/20   Fulp, Cammie, MD  glucose blood (TRUE METRIX BLOOD GLUCOSE TEST) test strip Use as instructed to initially check blood sugars twice per day- fasting in the morning and before bedtime 08/24/21   Storm Frisk, MD  medroxyPROGESTERone (DEPO-PROVERA) 150 MG/ML injection Inject 150 mg into the muscle  every 3 (three) months.     [provider]  metFORMIN (GLUCOPHAGE-XR) 750 MG 24 hr tablet Take 1 tablet (750 mg total) by mouth daily with breakfast. 07/26/22   Storm Frisk, MD  pantoprazole (PROTONIX) 40 MG tablet Take 1 tablet (40 mg total) by mouth daily before breakfast. 07/26/22   Storm Frisk, MD  timolol (TIMOPTIC) 0.5 % ophthalmic solution 1 drop 2 (two) times daily. 02/01/22   [provider]  TRUEplus Lancets 28G MISC Use to check blood sugar twice a day 08/24/21   Storm Frisk, MD  omeprazole (PRILOSEC) 20 MG capsule Take 1 capsule (20 mg total) by mouth daily. Patient not taking: No sig reported 04/10/18 01/02/20  Frederica Kuster, MD    Family History Family History  Problem Relation Age of Onset   Hypertension Mother    Diabetes Father      Social History Social History   Tobacco Use   Smoking status: Never   Smokeless tobacco: Never  Vaping Use   Vaping status: Never Used  Substance Use Topics   Alcohol use: Not Currently   Drug use: Never     Allergies   Patient has no known allergies.   Review of Systems Review of Systems  Per HPI   Physical Exam Triage Vital Signs ED Triage Vitals [01/27/23 1254]  Encounter Vitals Group     BP 111/74     Systolic BP Percentile      Diastolic BP Percentile      Pulse Rate 62     Resp 16     Temp 98.2 F (36.8 C)     Temp Source Oral     SpO2 98 %     Weight 194 lb (88 kg)     Height      Head Circumference      Peak Flow      Pain Score 0     Pain Loc      Pain Education      Exclude from Growth Chart    No data found.  Updated Vital Signs BP 111/74 (BP Location: Left Arm)   Pulse 62   Temp 98.2 F (36.8 C) (Oral)   Resp 16   Wt 194 lb (88 kg)   SpO2 98%   BMI 33.83 kg/m   Visual Acuity Right Eye Distance:   Left Eye Distance:   Bilateral Distance:    Right Eye Near:   Left Eye Near:    Bilateral Near:     Physical Exam Vitals and nursing note reviewed.  Constitutional:      Appearance: Normal appearance.  HENT:     Head: Normocephalic and atraumatic.     Right Ear: External ear normal.     Left Ear: External ear normal.     Nose: Nose normal.     Mouth/Throat:     Mouth: Mucous membranes are moist.  Eyes:     Conjunctiva/sclera: Conjunctivae normal.  Cardiovascular:     Rate and Rhythm: Normal rate.  Pulmonary:     Effort: Pulmonary effort is normal. No respiratory distress.  Abdominal:     General: Abdomen is flat.     Palpations: Abdomen is soft.     Tenderness: There is no abdominal tenderness. There is no right CVA tenderness or left CVA tenderness.  Musculoskeletal:        General: Normal range of motion.  Skin:    General: Skin is warm and dry.  Neurological:     General: No focal deficit present.     Mental  Status: She is alert.  Psychiatric:        Mood and Affect: Mood normal.        Behavior: Behavior is cooperative.      UC Treatments / Results  Labs (all labs ordered are listed, but only abnormal results are displayed) Labs Reviewed  POCT URINALYSIS DIP (MANUAL ENTRY) - Abnormal; Notable for the following components:      Result Value   Clarity, UA cloudy (*)    Glucose, UA =100 (*)    Ketones, POC UA trace (5) (*)    Spec Grav, UA >=1.030 (*)    Blood, UA small (*)    Protein Ur, POC =100 (*)    All other components within normal limits  POCT FASTING CBG [facility] MANUAL ENTRY - Abnormal; Notable for the following components:   POCT Glucose ([facility]) 161 (*)    All other components within normal limits  URINE CULTURE  CERVICOVAGINAL ANCILLARY ONLY    EKG   Radiology No results found.  Procedures Procedures (including critical care time)  Medications Ordered in UC Medications - No data to display  Initial Impression / Assessment and Plan / UC Course  I have reviewed the triage vital signs and the nursing notes.  Pertinent labs & imaging results that were available during my care of the patient were reviewed by me and considered in my medical decision making (see chart for details).  Vitals and triage reviewed, patient is hemodynamically stable.  Abdomen is soft and nontender, negative for CVA tenderness.  Urine sample shows cloudy urine with glucose, small red blood cells, trace ketones and protein.  Negative for nitrites or leukocytes, low concern for UTI this patient recently completed antibiotics.  Will send for culture to ensure since patient is symptomatic.  Cytology swab obtained, concern for yeast vaginitis due to type 2 diabetes and vaginal itching.  Will treat prophylactically with Diflucan.  CBG in clinic 161, not too high.  Plan of care, follow-up care and return precautions given, no questions at this time.      Final Clinical Impressions(s) / UC Diagnoses    Final diagnoses:  Urinary frequency  Acute vaginitis     Discharge Instructions      [name] Diflucan para aliviar la picazn vaginal. Nos comunicaremos con usted si los [employer] de su hisopado vaginal requieren un tratamiento adicional.  Su orina no mostr signos de infeccin, la enviaremos para un cultivo y nos comunicaremos con usted si se indican ms antibiticos.  Si la urgencia y frecuencia urinaria persisten, consulte con su mdico de atencin primaria. Asegrese de limitar la ingesta de cafena y refrescos, ya que son irritantes urinarios.  Take the Diflucan to help with the vaginal itching.  We will contact you if the results from your vaginal swab require additional treatment.  Your urine did not show signs of infection, we are sending this off for culture and we will contact you if more antibiotics are indicated.  If your urinary urgency and frequency persist, please follow-up with your primary care provider.  Ensure you are limiting your intake of caffeine and sodas, as these are urinary irritants.      ED Prescriptions     Medication Sig Dispense Auth. Provider   fluconazole (DIFLUCAN) 150 MG tablet Take 1 tablet (150 mg total) by mouth daily. 1 tablet Xylina Rhoads, [place] N, [place]      PDMP not  reviewed this encounter.   Rubel Heckard, [place] N, [place] 01/27/23 1339

## 2023-01-27 NOTE — ED Triage Notes (Signed)
Patient here today with c/o urinary frequency, vaginal itching, and vaginal discomfort since 01/14/2023. She has been using Monistat and AZO with no relief. She went to the health department and was given Sulfa. She completed the antibiotics with some relief but still having urinary frequency.

## 2023-01-27 NOTE — Discharge Instructions (Addendum)
[name] Diflucan para Associate Professor vaginal. Nos comunicaremos con usted si los [place] de su hisopado vaginal requieren un tratamiento adicional.  Su orina no mostr signos de infeccin, la enviaremos para un cultivo y nos comunicaremos con usted si se indican ms antibiticos.  Si la urgencia y frecuencia urinaria persisten, consulte con su mdico de atencin primaria. Asegrese de limitar la ingesta de cafena y refrescos, ya que son irritantes urinarios.  Take the Diflucan to help with the vaginal itching.  We will contact you if the results from your vaginal swab require additional treatment.  Your urine did not show signs of infection, we are sending this off for culture and we will contact you if more antibiotics are indicated.  If your urinary urgency and frequency persist, please follow-up with your primary care provider.  Ensure you are limiting your intake of caffeine and sodas, as these are urinary irritants.

## 2023-01-28 LAB — URINE CULTURE: Culture: NO GROWTH

## 2023-01-28 NOTE — Progress Notes (Unsigned)
Established Patient Office Visit  Subjective   Patient ID: Allison Monroe, female    DOB: 12/17/1985  Age: 37 y.o. MRN: [id number]  No chief complaint on file.    08/2021 Patient seen in return follow-up for primary care from gastroenterology perspective she is doing well she is on the Protonix and following a reflux diet her abdominal pain has resolved.  Patient continues to receive Depo-Provera monthly per gynecology.  Patient states her blood sugars however been running high in the mornings in the 140s to 160s.  In the afternoons are in the 100-1 20 range.  She has been skipping breakfast and taking the metformin at lunch.  She sometimes skips dinner.  She occasionally has spells of dizziness and shakiness late in the day.  After she eats this resolves.  She is trying to eat more healthy at this time.  Blood sugar on arrival today is 91 blood pressure 108/72  07/26/22 Patient seen in return follow-up for diabetes.  She also had foot fungus as well.  She is responded to the foot cream.  She did not respond to the Rogaine foam with her alopecia this has been discontinued.  She had an eye exam recently will obtain the report from [facility].  She needs a Pap smear.  Lipids need to be followed up.  On arrival A1c is at goal  02/01/23    Review of Systems  Constitutional:  Negative for chills, diaphoresis, fever, malaise/fatigue and weight loss.  HENT:  Negative for congestion, ear discharge, ear pain, hearing loss, nosebleeds, sore throat and tinnitus.   Eyes:  Negative for blurred vision, double vision, photophobia and discharge.  Respiratory:  Negative for cough, hemoptysis, sputum production, shortness of breath, wheezing and stridor.        No excess mucus  Cardiovascular:  Negative for chest pain, palpitations, orthopnea, claudication, leg swelling and PND.  Gastrointestinal:  Negative for abdominal pain, blood in stool, constipation, diarrhea, heartburn, melena, nausea and  vomiting.  Genitourinary:  Negative for dysuria, flank pain, frequency, hematuria and urgency.  Musculoskeletal:  Negative for back pain, falls, joint pain, myalgias and neck pain.  Skin:  Negative for itching and rash.  Neurological:  Negative for dizziness, tingling, tremors, sensory change, speech change, focal weakness, seizures, loss of consciousness, weakness and headaches.  Endo/Heme/Allergies:  Negative for environmental allergies and polydipsia. Does not bruise/bleed easily.  Psychiatric/Behavioral:  Negative for depression, hallucinations, memory loss, substance abuse and suicidal ideas. The patient is not nervous/anxious and does not have insomnia.   All other systems reviewed and are negative.     Objective:     There were no vitals taken for this visit.   Physical Exam Vitals reviewed.  Constitutional:      Appearance: Normal appearance. She is well-developed. She is obese. She is not diaphoretic.  HENT:     Head: Normocephalic and atraumatic.     Nose: No nasal deformity, septal deviation, mucosal edema or rhinorrhea.     Right Sinus: No maxillary sinus tenderness or frontal sinus tenderness.     Left Sinus: No maxillary sinus tenderness or frontal sinus tenderness.     Mouth/Throat:     Pharynx: No oropharyngeal exudate.  Eyes:     General: No scleral icterus.    Conjunctiva/sclera: Conjunctivae normal.     Pupils: Pupils are equal, round, and reactive to light.  Neck:     Thyroid: No thyromegaly.     Vascular: No carotid bruit or JVD.  Trachea: Trachea normal. No tracheal tenderness or tracheal deviation.  Cardiovascular:     Rate and Rhythm: Normal rate and regular rhythm.     Chest Wall: PMI is not displaced.     Pulses: Normal pulses. No decreased pulses.     Heart sounds: Normal heart sounds, S1 normal and S2 normal. Heart sounds not distant. No murmur heard.    No systolic murmur is present.     No diastolic murmur is present.     No friction rub. No  gallop. No S3 or S4 sounds.  Pulmonary:     Effort: No tachypnea, accessory muscle usage or respiratory distress.     Breath sounds: No stridor. No decreased breath sounds, wheezing, rhonchi or rales.  Chest:     Chest wall: No tenderness.  Abdominal:     General: Bowel sounds are normal. There is no distension.     Palpations: Abdomen is soft. Abdomen is not rigid.     Tenderness: There is no abdominal tenderness. There is no guarding or rebound.  Musculoskeletal:        General: Normal range of motion.     Cervical back: Normal range of motion and neck supple. No edema, erythema or rigidity. No muscular tenderness. Normal range of motion.     Comments: Foot exam normal  Lymphadenopathy:     Head:     Right side of head: No submental or submandibular adenopathy.     Left side of head: No submental or submandibular adenopathy.     Cervical: No cervical adenopathy.  Skin:    General: Skin is warm and dry.     Coloration: Skin is not pale.     Findings: No rash.     Nails: There is no clubbing.  Neurological:     Mental Status: She is alert and oriented to person, place, and time.     Sensory: No sensory deficit.  Psychiatric:        Speech: Speech normal.        Behavior: Behavior normal.      No results found for any visits on 02/01/23.     The ASCVD Risk score (Arnett DK, et al., 2019) failed to calculate for the following reasons:   The 2019 ASCVD risk score is only valid for ages 38 to 74    Assessment & Plan:   Problem List Items Addressed This Visit   None   No follow-ups on file.    Shan Levans, MD

## 2023-01-29 LAB — CERVICOVAGINAL ANCILLARY ONLY
Bacterial Vaginitis (gardnerella): NEGATIVE
Candida Glabrata: NEGATIVE
Candida Vaginitis: NEGATIVE
Chlamydia: NEGATIVE
Comment: NEGATIVE
Comment: NEGATIVE
Comment: NEGATIVE
Comment: NEGATIVE
Comment: NEGATIVE
Comment: NORMAL
Neisseria Gonorrhea: NEGATIVE
Trichomonas: NEGATIVE

## 2023-01-30 ENCOUNTER — Ambulatory Visit: Payer: Self-pay | Admitting: Critical Care Medicine

## 2023-02-01 ENCOUNTER — Encounter: Payer: Self-pay | Admitting: Critical Care Medicine

## 2023-02-01 ENCOUNTER — Ambulatory Visit: Payer: Self-pay

## 2023-02-01 ENCOUNTER — Other Ambulatory Visit: Payer: Self-pay | Admitting: Pharmacist

## 2023-02-01 ENCOUNTER — Ambulatory Visit: Payer: Self-pay | Attending: Critical Care Medicine | Admitting: Critical Care Medicine

## 2023-02-01 VITALS — BP 124/80 | HR 53 | Wt 192.8 lb

## 2023-02-01 DIAGNOSIS — E1169 Type 2 diabetes mellitus with other specified complication: Secondary | ICD-10-CM

## 2023-02-01 DIAGNOSIS — K219 Gastro-esophageal reflux disease without esophagitis: Secondary | ICD-10-CM

## 2023-02-01 DIAGNOSIS — R109 Unspecified abdominal pain: Secondary | ICD-10-CM | POA: Insufficient documentation

## 2023-02-01 DIAGNOSIS — E119 Type 2 diabetes mellitus without complications: Secondary | ICD-10-CM

## 2023-02-01 DIAGNOSIS — E785 Hyperlipidemia, unspecified: Secondary | ICD-10-CM

## 2023-02-01 DIAGNOSIS — D3501 Benign neoplasm of right adrenal gland: Secondary | ICD-10-CM

## 2023-02-01 DIAGNOSIS — E1165 Type 2 diabetes mellitus with hyperglycemia: Secondary | ICD-10-CM

## 2023-02-01 DIAGNOSIS — R3129 Other microscopic hematuria: Secondary | ICD-10-CM | POA: Insufficient documentation

## 2023-02-01 DIAGNOSIS — Z7984 Long term (current) use of oral hypoglycemic drugs: Secondary | ICD-10-CM

## 2023-02-01 LAB — POCT GLYCOSYLATED HEMOGLOBIN (HGB A1C): HbA1c, POC (controlled diabetic range): 7.3 % — AB (ref 0.0–7.0)

## 2023-02-01 MED ORDER — METFORMIN HCL ER (MOD) 1000 MG PO TB24: 1000.0000 mg | ORAL_TABLET | Freq: Every day | ORAL | 2 refills | Status: DC

## 2023-02-01 MED ORDER — ATORVASTATIN CALCIUM 20 MG PO TABS: 20.0000 mg | ORAL_TABLET | Freq: Every day | ORAL | 2 refills | Status: DC

## 2023-02-01 MED ORDER — METFORMIN HCL ER 500 MG PO TB24: 1000.0000 mg | ORAL_TABLET | Freq: Every day | ORAL | 1 refills | Status: DC

## 2023-02-01 MED ORDER — PANTOPRAZOLE SODIUM 40 MG PO TBEC: 40.0000 mg | DELAYED_RELEASE_TABLET | Freq: Every day | ORAL | 1 refills | Status: DC

## 2023-02-01 NOTE — Assessment & Plan Note (Signed)
Adrenal adenoma and rule out renal stones

## 2023-02-01 NOTE — Assessment & Plan Note (Signed)
Continue lipid therapy reassess lipid panel

## 2023-02-01 NOTE — Assessment & Plan Note (Signed)
New onset diabetes A1c elevated will increase metformin to 1000 mg daily long-acting check urine for albumin

## 2023-02-01 NOTE — Assessment & Plan Note (Signed)
As per flank pain and adrenal adenoma assessments

## 2023-02-01 NOTE — Assessment & Plan Note (Signed)
History of benign adrenal adenoma however now increased hematuria and right flank pain need to rule out renal stones urine culture previously has been normal  Obtain MRI of the abdomen and recheck urinalysis and blood work

## 2023-02-01 NOTE — Patient Instructions (Signed)
MRI of abdomen will be obtained Labs today Increase metformin to 1000 mg daily sent to [facility]

## 2023-02-01 NOTE — Telephone Encounter (Signed)
Chief Complaint: Medication Question   Disposition: [] ED /[] Urgent Care (no appt availability in office) / [] Appointment(In office/virtual)/ []  [place] [facility]/ [] Home Care/ [] Refused Recommended Disposition /[] [place] [facility]/ [x]  Follow-up with PCP Additional Notes: Spoke to [facility] the pharmacy tech, she stated that Metformin MOD is very expensive and the patient can not afford it.   Metformin ER 1000MG  or Metformin 1000MG  would be cheaper out of pocket for the patient. Kayla asked if PCP would be willing to send a new Rx for Metformin using one of the cheaper options. Please advise   Summary: medication question   Kayla from the pharmacy sttd patient does not have insurance and the metFORMIN (GLUMETZA) 1000 MG (MOD) 24 hr tablet is a very expensive medication. Please f/u with pharmacy     Reason for Disposition  [1] Pharmacy calling with prescription question AND [2] triager unable to answer question  Answer Assessment - Initial Assessment Questions 1. NAME of MEDICINE: "What medicine(s) are you calling about?"     metFORMIN (GLUMETZA) 1000 MG (MOD) 24 hr tablet  2. QUESTION: "What is your question?" (e.g., double dose of medicine, side effect)     Pharmacy stated the medication is very expensive, would you like to send a new Rx for a lower cost to patient.  3. PRESCRIBER: "Who prescribed the medicine?" Reason: if prescribed by specialist, call should be referred to that group.     Dr. Delford Field  Protocols used: Medication Question Call-A-AH

## 2023-02-03 LAB — CBC WITH DIFFERENTIAL/PLATELET
Basophils Absolute: 0 10*3/uL (ref 0.0–0.2)
Basos: 1 %
EOS (ABSOLUTE): 0.1 10*3/uL (ref 0.0–0.4)
Eos: 1 %
Hematocrit: 42.1 % (ref 34.0–46.6)
Hemoglobin: 13.9 g/dL (ref 11.1–15.9)
Immature Grans (Abs): 0.1 10*3/uL (ref 0.0–0.1)
Immature Granulocytes: 1 %
Lymphocytes Absolute: 3.4 10*3/uL — ABNORMAL HIGH (ref 0.7–3.1)
Lymphs: 43 %
MCH: 29.4 pg (ref 26.6–33.0)
MCHC: 33 g/dL (ref 31.5–35.7)
MCV: 89 fL (ref 79–97)
Monocytes Absolute: 0.4 10*3/uL (ref 0.1–0.9)
Monocytes: 5 %
Neutrophils Absolute: 3.9 10*3/uL (ref 1.4–7.0)
Neutrophils: 49 %
Platelets: 366 10*3/uL (ref 150–450)
RBC: 4.73 x10E6/uL (ref 3.77–5.28)
RDW: 12.6 % (ref 11.7–15.4)
WBC: 7.8 10*3/uL (ref 3.4–10.8)

## 2023-02-03 LAB — URINALYSIS
Bilirubin, UA: NEGATIVE
Glucose, UA: NEGATIVE
Ketones, UA: NEGATIVE
Leukocytes,UA: NEGATIVE
Nitrite, UA: NEGATIVE
RBC, UA: NEGATIVE
Specific Gravity, UA: 1.023 (ref 1.005–1.030)
Urobilinogen, Ur: 0.2 mg/dL (ref 0.2–1.0)
pH, UA: 6 (ref 5.0–7.5)

## 2023-02-03 LAB — COMPREHENSIVE METABOLIC PANEL
ALT: 23 [IU]/L (ref 0–32)
AST: 20 IU/L (ref 0–40)
Albumin: 4.2 g/dL (ref 3.9–4.9)
Alkaline Phosphatase: 124 IU/L — ABNORMAL HIGH (ref 44–121)
BUN/Creatinine Ratio: 18 (ref 9–23)
BUN: 15 mg/dL (ref 6–20)
Bilirubin Total: 0.4 mg/dL (ref 0.0–1.2)
CO2: 20 mmol/L (ref 20–29)
Calcium: 9.7 mg/dL (ref 8.7–10.2)
Chloride: 104 mmol/L (ref 96–106)
Creatinine, Ser: 0.85 mg/dL (ref 0.57–1.00)
Globulin, Total: 3.2 g/dL (ref 1.5–4.5)
Glucose: 125 mg/dL — ABNORMAL HIGH (ref 70–99)
Potassium: 4.5 mmol/L (ref 3.5–5.2)
Sodium: 140 mmol/L (ref 134–144)
Total Protein: 7.4 g/dL (ref 6.0–8.5)
eGFR: 90 mL/min/{1.73_m2} (ref >59)

## 2023-02-03 LAB — MICROALBUMIN / CREATININE URINE RATIO
Creatinine, Urine: 189.8 mg/dL
Microalb/Creat Ratio: 316 mg/g{creat} — ABNORMAL HIGH (ref 0–29)
Microalbumin, Urine: 599.8 ug/mL

## 2023-02-04 ENCOUNTER — Other Ambulatory Visit: Payer: Self-pay | Admitting: Critical Care Medicine

## 2023-02-04 DIAGNOSIS — R809 Proteinuria, unspecified: Secondary | ICD-10-CM

## 2023-02-04 MED ORDER — LISINOPRIL 5 MG PO TABS: 5.0000 mg | ORAL_TABLET | Freq: Every day | ORAL | 2 refills | Status: DC

## 2023-02-04 NOTE — Progress Notes (Signed)
Let pt know labs normal but there is protein in urine from diabetes, a low dose med lisinopril sent to protect kidney

## 2023-02-06 ENCOUNTER — Telehealth: Payer: Self-pay

## 2023-02-06 NOTE — Telephone Encounter (Signed)
-----   Message from Shan Levans sent at 02/04/2023  7:52 AM EST ----- Let pt know labs normal but there is protein in urine from diabetes, a low dose med lisinopril sent to protect kidney

## 2023-02-06 NOTE — Telephone Encounter (Signed)
Pt was called and is aware of results, DOB was confirmed.  Interpreter id # Elita Quick [phone]

## 2023-03-09 ENCOUNTER — Ambulatory Visit
Admission: RE | Admit: 2023-03-09 | Discharge: 2023-03-09 | Disposition: A | Discharge status: Home / Self Care | Payer: Self-pay | Source: Ambulatory Visit | Attending: Critical Care Medicine | Admitting: Critical Care Medicine

## 2023-03-09 DIAGNOSIS — R3129 Other microscopic hematuria: Secondary | ICD-10-CM

## 2023-03-09 DIAGNOSIS — R109 Unspecified abdominal pain: Secondary | ICD-10-CM

## 2023-03-09 DIAGNOSIS — D3501 Benign neoplasm of right adrenal gland: Secondary | ICD-10-CM

## 2023-03-09 MED ORDER — GADOPICLENOL 0.5 MMOL/ML IV SOLN
9.0000 mL | Freq: Once | INTRAVENOUS | Status: AC | PRN
Start: 2023-03-09 — End: 2023-03-09
  Administered 2023-03-09: 9 mL via INTRAVENOUS

## 2023-03-13 ENCOUNTER — Telehealth: Payer: Self-pay

## 2023-03-13 NOTE — Telephone Encounter (Signed)
-----   Message from Roney Jaffe sent at 03/12/2023  8:35 AM EST ----- Please call patient and let her know that her MRI did not show any abnormalities and the adenoma of her right adrenal gland was unchanged requiring no further follow-up.  If she is still experiencing pain in hematuria, I can start a referral for her to be seen by urology.

## 2023-03-13 NOTE — Telephone Encounter (Signed)
Pt was called and is aware of results, DOB was confirmed    Interpreter: Onalee Hua  Number: [phone]

## 2023-05-14 LAB — HM DIABETES EYE EXAM

## 2023-06-01 ENCOUNTER — Encounter: Payer: Self-pay | Admitting: Internal Medicine

## 2023-06-01 ENCOUNTER — Ambulatory Visit: Payer: Self-pay | Attending: Internal Medicine | Admitting: Internal Medicine

## 2023-06-01 VITALS — BP 118/67 | HR 53 | Temp 98.1°F | Ht 63.0 in | Wt 179.0 lb

## 2023-06-01 DIAGNOSIS — Z23 Encounter for immunization: Secondary | ICD-10-CM

## 2023-06-01 DIAGNOSIS — E785 Hyperlipidemia, unspecified: Secondary | ICD-10-CM

## 2023-06-01 DIAGNOSIS — E1169 Type 2 diabetes mellitus with other specified complication: Secondary | ICD-10-CM

## 2023-06-01 DIAGNOSIS — M79671 Pain in right foot: Secondary | ICD-10-CM

## 2023-06-01 DIAGNOSIS — E119 Type 2 diabetes mellitus without complications: Secondary | ICD-10-CM

## 2023-06-01 DIAGNOSIS — E669 Obesity, unspecified: Secondary | ICD-10-CM

## 2023-06-01 DIAGNOSIS — R252 Cramp and spasm: Secondary | ICD-10-CM

## 2023-06-01 DIAGNOSIS — R809 Proteinuria, unspecified: Secondary | ICD-10-CM

## 2023-06-01 DIAGNOSIS — Z6831 Body mass index (BMI) 31.0-31.9, adult: Secondary | ICD-10-CM

## 2023-06-01 DIAGNOSIS — Z7984 Long term (current) use of oral hypoglycemic drugs: Secondary | ICD-10-CM

## 2023-06-01 LAB — POCT GLYCOSYLATED HEMOGLOBIN (HGB A1C): HbA1c, POC (controlled diabetic range): 6.3 % (ref 0.0–7.0)

## 2023-06-01 MED ORDER — ATORVASTATIN CALCIUM 10 MG PO TABS: 10.0000 mg | ORAL_TABLET | Freq: Every day | ORAL | 3 refills | Status: DC

## 2023-06-01 MED ORDER — IBUPROFEN 600 MG PO TABS
600.0000 mg | ORAL_TABLET | Freq: Three times a day (TID) | ORAL | 0 refills | Status: AC | PRN
Start: 2023-06-01 — End: ?

## 2023-06-01 MED ORDER — TRUE METRIX BLOOD GLUCOSE TEST VI STRP: ORAL_STRIP | 2 refills | Status: AC

## 2023-06-01 MED ORDER — TRUEPLUS LANCETS 28G MISC: 3 refills | Status: AC

## 2023-06-01 MED ORDER — LISINOPRIL 5 MG PO TABS: 5.0000 mg | ORAL_TABLET | Freq: Every day | ORAL | 2 refills | Status: DC

## 2023-06-01 NOTE — Patient Instructions (Signed)
 The next time you have inflammation in the right foot, please come in for an urgent care visit.  Use the ibuprofen as needed.  Take with food.  Refill sent to your pharmacy on lisinopril.  The atorvastatin may be causing or contributing to the leg cramps.  I recommend decreasing the atorvastatin from 20 mg daily to 10 mg daily.  Let me know if the cramps persist.

## 2023-06-01 NOTE — Progress Notes (Signed)
 Patient ID: Allison Monroe, female    DOB: [birth year]  MRN: [id number]  CC: Diabetes (DM f/u. Med refills. /Discuss Lisinopril - pt unsure if she needs to continue /Fatigue / weakness on the knees radiating to feet, intermittent swelling of R foot X2 weeks /)   Subjective: Allison Monroe is a 38 y.o. female who presents for chronic ds management.  Previous PCP is Dr. Delford Field who has retired. Her concerns today include:  Patient with history of DM type II, HL, GERD, right adrenal adenoma stable on MRI 02/2023.  No further follow-up needed.  [employer] Language interpreter used during this encounter. #[id number], Alvaro Pt speaks a little english.  DM: Results for orders placed or performed in visit on 06/01/23  POCT glycosylated hemoglobin (Hb A1C)   Collection Time: 06/01/23  8:58 AM  Result Value Ref Range   Hemoglobin A1C     HbA1c POC (<> result, manual entry)     HbA1c, POC (prediabetic range)     HbA1c, POC (controlled diabetic range) 6.3 0.0 - 7.0 %  On Metformin XR 1 gram daily. Checks BS but not regularly Since Dec she has cut out sugary drinks and sweet treats. Also starting going to gym 2x/wk and walks 30 mins the rest of the days.  Down 13 lbs since 01/2023 HL:  taking and tolerating Lipitor 20 mg daily Started on Lisinopril 5 mg after +microalb in urine 316.  Took it but thinks she is out of RF.    C/o issue with RT foot that started 3 wks ago; ball of foot surrounding the big toe got swollen/inflam. Endorses some redness. She had iced it.  It helped but still has residual pain.  This was the first time this happened.  She stopped exercising because of it. Since then she has felt tiredness in both feet and tiredness in calf BL.  No pain in calf with ambulation but does get cramps in calf even when laying down. No swelling in lower legs.  HM:  Had flu shot at [facility] in 01/2023. Due for PCV 15 or 20. Patient Active Problem List   Diagnosis Date Noted   Right flank pain  02/01/2023   Other microscopic hematuria 02/01/2023   Contact dermatitis 05/26/2021   Microalbuminuria 04/26/2021   Gastroesophageal reflux disease without esophagitis 04/25/2021   Androgenetic alopecia 05/31/2020   Glaucoma of both eyes 03/08/2020   New onset type 2 diabetes mellitus (HCC) 03/08/2020   Umbilical hernia 03/08/2020   Benign Adenoma of right adrenal gland 03/08/2020   Hyperlipidemia due to type 2 diabetes mellitus (HCC) 02/22/2020     Current Outpatient Medications on File Prior to Visit  Medication Sig Dispense Refill   Blood Glucose Monitoring Suppl (TRUE METRIX METER) w/Device KIT Use as directed to check blood sugars 1 kit 0   medroxyPROGESTERone (DEPO-PROVERA) 150 MG/ML injection Inject 150 mg into the muscle every 3 (three) months.      metFORMIN (GLUCOPHAGE-XR) 500 MG 24 hr tablet Take 2 tablets (1,000 mg total) by mouth daily with breakfast. 180 tablet 1   pantoprazole (PROTONIX) 40 MG tablet Take 1 tablet (40 mg total) by mouth daily before breakfast. 90 tablet 1   timolol (TIMOPTIC) 0.5 % ophthalmic solution 1 drop 2 (two) times daily.     [DISCONTINUED] omeprazole (PRILOSEC) 20 MG capsule Take 1 capsule (20 mg total) by mouth daily. (Patient not taking: No sig reported) 15 capsule 1   No current facility-administered medications on file prior to visit.  No Known Allergies  Social History   Socioeconomic History   Marital status: Single    Spouse name: Not on file   Number of children: Not on file   Years of education: Not on file   Highest education level: Not on file  Occupational History   Not on file  Tobacco Use   Smoking status: Never   Smokeless tobacco: Never  Vaping Use   Vaping status: Never Used  Substance and Sexual Activity   Alcohol use: Not Currently   Drug use: Never   Sexual activity: Yes  Other Topics Concern   Not on file  Social History Narrative   Living with husband and daughters   Right handed   Social Drivers of  Health   Financial Resource Strain: Medium Risk (02/01/2023)   Overall Financial Resource Strain (CARDIA)    Difficulty of Paying Living Expenses: Somewhat hard  Food Insecurity: No Food Insecurity (02/01/2023)   Hunger Vital Sign    Worried About Running Out of Food in the Last Year: Never true    Ran Out of Food in the Last Year: Never true  Transportation Needs: No Transportation Needs (02/01/2023)   PRAPARE - Administrator, Civil Service (Medical): No    Lack of Transportation (Non-Medical): No  Physical Activity: Insufficiently Active (02/01/2023)   Exercise Vital Sign    Days of Exercise per Week: 1 day    Minutes of Exercise per Session: 40 min  Stress: Stress Concern Present (02/01/2023)   [employer] of Occupational Health - Occupational Stress Questionnaire    Feeling of Stress : Rather much  Social Connections: Not on file  Intimate Partner Violence: Not on file    Family History  Problem Relation Age of Onset   Hypertension Mother    Diabetes Father     Past Surgical History:  Procedure Laterality Date   NO PAST SURGERIES      ROS: Review of Systems Negative except as stated above  PHYSICAL EXAM: BP 118/67 (BP Location: Left Arm, Patient Position: Sitting, Cuff Size: Normal)   Pulse (!) 53   Temp 98.1 F (36.7 C) (Oral)   Ht 5\' 3"  (1.6 m)   Wt 179 lb (81.2 kg)   SpO2 100%   BMI 31.71 kg/m   Wt Readings from Last 3 Encounters:  06/01/23 179 lb (81.2 kg)  02/01/23 192 lb 12.8 oz (87.5 kg)  01/27/23 194 lb (88 kg)    Physical Exam  General appearance - alert, well appearing, middle-age Hispanic female and in no distress Mental status - normal mood, behavior, speech, dress, motor activity, and thought processes Chest - clear to auscultation, no wheezes, rales or rhonchi, symmetric air entry Heart - normal rate, regular rhythm, normal S1, S2, no murmurs, rubs, clicks or gallops Musculoskeletal - RT foot: No edema or erythema.  Mild  discomfort with passive range of motion of the right toe. Extremities - peripheral pulses normal, no pedal edema, no clubbing or cyanosis. Diabetic Foot Exam - Simple   Simple Foot Form Diabetic Foot exam was performed with the following findings: Yes 06/01/2023  9:30 AM  Visual Inspection No deformities, no ulcerations, no other skin breakdown bilaterally: Yes Sensation Testing Intact to touch and monofilament testing bilaterally: Yes Pulse Check Posterior Tibialis and Dorsalis pulse intact bilaterally: Yes Comments          Latest Ref Rng & Units 02/01/2023    9:02 AM 07/26/2022   11:52 AM 01/25/2022  9:08 AM  CMP  Glucose 70 - 99 mg/dL 295  284  132   BUN 6 - 20 mg/dL 15  13  19    Creatinine 0.57 - 1.00 mg/dL 4.40  1.02  7.25   Sodium 134 - 144 mmol/L 140  139  140   Potassium 3.5 - 5.2 mmol/L 4.5  4.4  4.4   Chloride 96 - 106 mmol/L 104  104  104   CO2 20 - 29 mmol/L 20  20  20    Calcium 8.7 - 10.2 mg/dL 9.7  36.6  9.6   Total Protein 6.0 - 8.5 g/dL 7.4   7.3   Total Bilirubin 0.0 - 1.2 mg/dL 0.4   0.5   Alkaline Phos 44 - 121 IU/L 124   93   AST 0 - 40 IU/L 20   21   ALT 0 - 32 IU/L 23   29    Lipid Panel     Component Value Date/Time   CHOL 152 07/26/2022 1152   TRIG 120 07/26/2022 1152   HDL 42 07/26/2022 1152   CHOLHDL 3.6 07/26/2022 1152   LDLCALC 88 07/26/2022 1152    CBC    Component Value Date/Time   WBC 7.8 02/01/2023 0902   WBC 10.5 01/20/2020 1612   RBC 4.73 02/01/2023 0902   RBC 4.49 01/20/2020 1612   HGB 13.9 02/01/2023 0902   HCT 42.1 02/01/2023 0902   PLT 366 02/01/2023 0902   MCV 89 02/01/2023 0902   MCH 29.4 02/01/2023 0902   MCH 28.7 01/20/2020 1612   MCHC 33.0 02/01/2023 0902   MCHC 32.5 01/20/2020 1612   RDW 12.6 02/01/2023 0902   LYMPHSABS 3.4 (H) 02/01/2023 0902   EOSABS 0.1 02/01/2023 0902   BASOSABS 0.0 02/01/2023 0902    ASSESSMENT AND PLAN: 1. Type 2 diabetes mellitus with obesity (HCC) (Primary) At goal.  Commended her  on changing her eating habits and regular exercise.  Encouraged her to keep up the good works.  Continue metformin XR 1000 mg daily - POCT glycosylated hemoglobin (Hb A1C) - glucose blood (TRUE METRIX BLOOD GLUCOSE TEST) test strip; Use as instructed to initially check blood sugars twice per day- fasting in the morning and before bedtime  Dispense: 100 each; Refill: 2 - TRUEplus Lancets 28G MISC; Use to check blood sugar twice a day  Dispense: 100 each; Refill: 3 - Basic Metabolic Panel  2. Diabetes mellitus treated with oral medication (HCC) See #1 above.  3. Hyperlipidemia associated with type 2 diabetes mellitus (HCC) Patient on atorvastatin 20 mg daily.  I suspect some of the cramps that she is having may be due to the atorvastatin.  I recommend decreasing the dose to 10 mg daily.  Let me know if cramps persist.  4. Microalbuminuria Refill given on lisinopril. - lisinopril (ZESTRIL) 5 MG tablet; Take 1 tablet (5 mg total) by mouth daily.  Dispense: 90 tablet; Refill: 2  5. Bilateral leg cramps See #3 above - atorvastatin (LIPITOR) 10 MG tablet; Take 1 tablet (10 mg total) by mouth daily.  Dispense: 90 tablet; Refill: 3 - Basic Metabolic Panel  6. Right foot pain I suspect she may have had an episode of gout.  Advised that the next time this occurs she can come in to be evaluated.  She still has some residual pain so we will give a short course of ibuprofen. - ibuprofen (ADVIL) 600 MG tablet; Take 1 tablet (600 mg total) by mouth every 8 (eight)  hours as needed. Take with food  Dispense: 20 tablet; Refill: 0  7. Need for vaccination against Streptococcus pneumoniae PCV 20 given today.  Patient was given the opportunity to ask questions.  Patient verbalized understanding of the plan and was able to repeat key elements of the plan.   This documentation was completed using Paediatric nurse.  Any transcriptional errors are unintentional.  Orders Placed This Encounter   Procedures   Pneumococcal conjugate vaccine 20-valent   Basic Metabolic Panel   POCT glycosylated hemoglobin (Hb A1C)     Requested Prescriptions   Signed Prescriptions Disp Refills   lisinopril (ZESTRIL) 5 MG tablet 90 tablet 2    Sig: Take 1 tablet (5 mg total) by mouth daily.   glucose blood (TRUE METRIX BLOOD GLUCOSE TEST) test strip 100 each 2    Sig: Use as instructed to initially check blood sugars twice per day- fasting in the morning and before bedtime   TRUEplus Lancets 28G MISC 100 each 3    Sig: Use to check blood sugar twice a day   ibuprofen (ADVIL) 600 MG tablet 20 tablet 0    Sig: Take 1 tablet (600 mg total) by mouth every 8 (eight) hours as needed. Take with food   atorvastatin (LIPITOR) 10 MG tablet 90 tablet 3    Sig: Take 1 tablet (10 mg total) by mouth daily.    Return in about 4 months (around 10/01/2023).  Jonah Blue, MD, FACP

## 2023-06-02 ENCOUNTER — Encounter: Payer: Self-pay | Admitting: Internal Medicine

## 2023-06-02 LAB — BASIC METABOLIC PANEL
BUN/Creatinine Ratio: 20 (ref 9–23)
BUN: 17 mg/dL (ref 6–20)
CO2: 19 mmol/L — ABNORMAL LOW (ref 20–29)
Calcium: 10 mg/dL (ref 8.7–10.2)
Chloride: 105 mmol/L (ref 96–106)
Creatinine, Ser: 0.85 mg/dL (ref 0.57–1.00)
Glucose: 103 mg/dL — ABNORMAL HIGH (ref 70–99)
Potassium: 4.7 mmol/L (ref 3.5–5.2)
Sodium: 140 mmol/L (ref 134–144)
eGFR: 90 mL/min/{1.73_m2} (ref >59)

## 2023-06-04 ENCOUNTER — Telehealth: Payer: Self-pay | Admitting: Internal Medicine

## 2023-06-04 NOTE — Telephone Encounter (Signed)
 Copied from CRM [id number]. Topic: General - Other >> Jun 04, 2023 12:30 PM Allison Monroe wrote: Reason for CRM: patient rcvd vaccine last appt:  had red area, fever 2 nights and now itching.. pls call her.. wants to make sure that is normal?  She needs interpreter when you call.. [phone]

## 2023-06-05 ENCOUNTER — Ambulatory Visit: Payer: Self-pay | Attending: Internal Medicine | Admitting: Internal Medicine

## 2023-06-05 ENCOUNTER — Encounter: Payer: Self-pay | Admitting: Internal Medicine

## 2023-06-05 VITALS — BP 115/79 | HR 59 | Temp 97.9°F | Ht 63.0 in | Wt 180.0 lb

## 2023-06-05 DIAGNOSIS — T50Z95A Adverse effect of other vaccines and biological substances, initial encounter: Secondary | ICD-10-CM

## 2023-06-05 NOTE — Progress Notes (Addendum)
 Patient ID: Quinesha Selinger, female    DOB: [birth year]  MRN: [id number]  CC: Allergic Reaction (Possible allergic reaction /Fever on Friday & Saturday after receiving pneum vax, Red mass on administration site, hot to touch /Concern about eye on L eyelid drooping since vax )   Subjective: Layali Freund is a 38 y.o. female who presents for UC visit Her concerns today include:  Patient with history of DM type II, HL, GERD, right adrenal adenoma stable on MRI 02/2023. No further follow-up needed.   [employer] Language interpreter used during this encounter. #Alfredo [id number]  Pt seen 06/01/23 and was given PCV 20 vaccine Later that afternoon she develop fever of 100.2/101.3 and pain at the injection site of LT deltoid that radiated to LT side of neck and ear.  Inj site was swollen , red and hot to touch. The next day both eyes were red and noted LT eye looked smaller ? swelling. Itching at the site. No fever yesterday but still feels LT eye look smaller and having to close eyes as if sleepy.  Took Tylenol for pain and fever. No reaction in past to other vaccines like this.    Patient Active Problem List   Diagnosis Date Noted   Right flank pain 02/01/2023   Other microscopic hematuria 02/01/2023   Contact dermatitis 05/26/2021   Microalbuminuria 04/26/2021   Gastroesophageal reflux disease without esophagitis 04/25/2021   Androgenetic alopecia 05/31/2020   Glaucoma of both eyes 03/08/2020   New onset type 2 diabetes mellitus (HCC) 03/08/2020   Umbilical hernia 03/08/2020   Benign Adenoma of right adrenal gland 03/08/2020   Hyperlipidemia due to type 2 diabetes mellitus (HCC) 02/22/2020     Current Outpatient Medications on File Prior to Visit  Medication Sig Dispense Refill   atorvastatin (LIPITOR) 10 MG tablet Take 1 tablet (10 mg total) by mouth daily. 90 tablet 3   Blood Glucose Monitoring Suppl (TRUE METRIX METER) w/Device KIT Use as directed to check blood sugars 1 kit 0    glucose blood (TRUE METRIX BLOOD GLUCOSE TEST) test strip Use as instructed to initially check blood sugars twice per day- fasting in the morning and before bedtime 100 each 2   ibuprofen (ADVIL) 600 MG tablet Take 1 tablet (600 mg total) by mouth every 8 (eight) hours as needed. Take with food 20 tablet 0   lisinopril (ZESTRIL) 5 MG tablet Take 1 tablet (5 mg total) by mouth daily. 90 tablet 2   medroxyPROGESTERone (DEPO-PROVERA) 150 MG/ML injection Inject 150 mg into the muscle every 3 (three) months.      metFORMIN (GLUCOPHAGE-XR) 500 MG 24 hr tablet Take 2 tablets (1,000 mg total) by mouth daily with breakfast. 180 tablet 1   pantoprazole (PROTONIX) 40 MG tablet Take 1 tablet (40 mg total) by mouth daily before breakfast. 90 tablet 1   timolol (TIMOPTIC) 0.5 % ophthalmic solution 1 drop 2 (two) times daily.     TRUEplus Lancets 28G MISC Use to check blood sugar twice a day 100 each 3   [DISCONTINUED] omeprazole (PRILOSEC) 20 MG capsule Take 1 capsule (20 mg total) by mouth daily. (Patient not taking: No sig reported) 15 capsule 1   No current facility-administered medications on file prior to visit.    Allergies  Allergen Reactions   Prevnar 20 [Pneumococcal 20-Val Conj Vacc]     Redness, swelling, and itching at the injection site.  Fever.  Redness of the eyes    Social History   Socioeconomic  History   Marital status: Single    Spouse name: Not on file   Number of children: Not on file   Years of education: Not on file   Highest education level: Not on file  Occupational History   Not on file  Tobacco Use   Smoking status: Never   Smokeless tobacco: Never  Vaping Use   Vaping status: Never Used  Substance and Sexual Activity   Alcohol use: Not Currently   Drug use: Never   Sexual activity: Yes  Other Topics Concern   Not on file  Social History Narrative   Living with husband and daughters   Right handed   Social Drivers of Health   Financial Resource Strain:  Medium Risk (02/01/2023)   Overall Financial Resource Strain (CARDIA)    Difficulty of Paying Living Expenses: Somewhat hard  Food Insecurity: No Food Insecurity (02/01/2023)   Hunger Vital Sign    Worried About Running Out of Food in the Last Year: Never true    Ran Out of Food in the Last Year: Never true  Transportation Needs: No Transportation Needs (02/01/2023)   PRAPARE - Administrator, Civil Service (Medical): No    Lack of Transportation (Non-Medical): No  Physical Activity: Insufficiently Active (02/01/2023)   Exercise Vital Sign    Days of Exercise per Week: 1 day    Minutes of Exercise per Session: 40 min  Stress: Stress Concern Present (02/01/2023)   [employer] of Occupational Health - Occupational Stress Questionnaire    Feeling of Stress : Rather much  Social Connections: Not on file  Intimate Partner Violence: Not on file    Family History  Problem Relation Age of Onset   Hypertension Mother    Diabetes Father     Past Surgical History:  Procedure Laterality Date   NO PAST SURGERIES      ROS: Review of Systems Negative except as stated above  PHYSICAL EXAM: BP 115/79 (BP Location: Left Arm, Patient Position: Sitting, Cuff Size: Normal)   Pulse (!) 59   Temp 97.9 F (36.6 C) (Oral)   Ht 5\' 3"  (1.6 m)   Wt 180 lb (81.6 kg)   SpO2 100%   BMI 31.89 kg/m   Physical Exam  General appearance - alert, well appearing, middle age Hispanic female and in no distress Mental status - normal mood, behavior, speech, dress, motor activity, and thought processes Neck: Supple.  No cervical lymphadenopathy appreciated. Eyes: No conjunctival injection/redness noted. Left upper eyelid appears slightly drooped compared to the right. No periorbital edema noted Ears: Both ear canal and tympanic membrane are within normal limits Neuro: Cranial nerves are grossly intact.  No signs of facial droop.  Pupils are equal and reactive.  Extraocular movement  intact.  Cranial nerve VII demonstrated with closing the eyes against resistance appears intact. Skin - no erythema noted on LT deltoid. Slightly raised palpable swelling noted over deltoid that is mildly tender to touch.  No increase warmth.     Latest Ref Rng & Units 06/01/2023    9:40 AM 02/01/2023    9:02 AM 07/26/2022   11:52 AM  CMP  Glucose 70 - 99 mg/dL 478  295  621   BUN 6 - 20 mg/dL 17  15  13    Creatinine 0.57 - 1.00 mg/dL 3.08  6.57  8.46   Sodium 134 - 144 mmol/L 140  140  139   Potassium 3.5 - 5.2 mmol/L 4.7  4.5  4.4   Chloride 96 - 106 mmol/L 105  104  104   CO2 20 - 29 mmol/L 19  20  20    Calcium 8.7 - 10.2 mg/dL 95.6  9.7  38.7   Total Protein 6.0 - 8.5 g/dL  7.4    Total Bilirubin 0.0 - 1.2 mg/dL  0.4    Alkaline Phos 44 - 121 IU/L  124    AST 0 - 40 IU/L  20    ALT 0 - 32 IU/L  23     Lipid Panel     Component Value Date/Time   CHOL 152 07/26/2022 1152   TRIG 120 07/26/2022 1152   HDL 42 07/26/2022 1152   CHOLHDL 3.6 07/26/2022 1152   LDLCALC 88 07/26/2022 1152    CBC    Component Value Date/Time   WBC 7.8 02/01/2023 0902   WBC 10.5 01/20/2020 1612   RBC 4.73 02/01/2023 0902   RBC 4.49 01/20/2020 1612   HGB 13.9 02/01/2023 0902   HCT 42.1 02/01/2023 0902   PLT 366 02/01/2023 0902   MCV 89 02/01/2023 0902   MCH 29.4 02/01/2023 0902   MCH 28.7 01/20/2020 1612   MCHC 33.0 02/01/2023 0902   MCHC 32.5 01/20/2020 1612   RDW 12.6 02/01/2023 0902   LYMPHSABS 3.4 (H) 02/01/2023 0902   EOSABS 0.1 02/01/2023 0902   BASOSABS 0.0 02/01/2023 0902    ASSESSMENT AND PLAN: 1. Adverse effect of vaccine, initial encounter (Primary) Advised that redness and swelling can occur at the injection site.  Sometimes systemic symptoms including fever may occur.  We will avoid giving pneumonia vaccine in the future. Advised to take ibuprofen and use warm compresses at the injection site.  Use over-the-counter Benadryl as needed for any residual itching.  Advised that  Benadryl can cause drowsiness.  I think symptoms will continue to to get better.  Follow-up if they do not or if there is any regression.  Patient was given the opportunity to ask questions.  Patient verbalized understanding of the plan and was able to repeat key elements of the plan.   This documentation was completed using Paediatric nurse.  Any transcriptional errors are unintentional.  No orders of the defined types were placed in this encounter.    Requested Prescriptions    No prescriptions requested or ordered in this encounter    No follow-ups on file.  Jonah Blue, MD, FACP

## 2023-06-05 NOTE — Telephone Encounter (Signed)
 Patient has been scheduled an appointment with PCP on 06/05/2023 to address concerns. Patient confirmed appointment.

## 2023-07-24 ENCOUNTER — Other Ambulatory Visit: Payer: Self-pay | Admitting: Family Medicine

## 2023-07-24 ENCOUNTER — Other Ambulatory Visit: Payer: Self-pay | Admitting: Critical Care Medicine

## 2023-07-24 DIAGNOSIS — K219 Gastro-esophageal reflux disease without esophagitis: Secondary | ICD-10-CM

## 2023-10-01 ENCOUNTER — Encounter: Payer: Self-pay | Admitting: Internal Medicine

## 2023-10-01 ENCOUNTER — Ambulatory Visit: Payer: Self-pay | Attending: Internal Medicine | Admitting: Internal Medicine

## 2023-10-01 VITALS — BP 110/74 | HR 59 | Temp 98.2°F | Resp 14 | Ht 63.0 in | Wt 181.8 lb

## 2023-10-01 DIAGNOSIS — E785 Hyperlipidemia, unspecified: Secondary | ICD-10-CM

## 2023-10-01 DIAGNOSIS — R809 Proteinuria, unspecified: Secondary | ICD-10-CM

## 2023-10-01 DIAGNOSIS — B353 Tinea pedis: Secondary | ICD-10-CM

## 2023-10-01 DIAGNOSIS — E1169 Type 2 diabetes mellitus with other specified complication: Secondary | ICD-10-CM

## 2023-10-01 DIAGNOSIS — R252 Cramp and spasm: Secondary | ICD-10-CM

## 2023-10-01 DIAGNOSIS — E669 Obesity, unspecified: Secondary | ICD-10-CM

## 2023-10-01 DIAGNOSIS — Z6832 Body mass index (BMI) 32.0-32.9, adult: Secondary | ICD-10-CM

## 2023-10-01 DIAGNOSIS — E119 Type 2 diabetes mellitus without complications: Secondary | ICD-10-CM

## 2023-10-01 DIAGNOSIS — M25531 Pain in right wrist: Secondary | ICD-10-CM

## 2023-10-01 DIAGNOSIS — Z7984 Long term (current) use of oral hypoglycemic drugs: Secondary | ICD-10-CM

## 2023-10-01 LAB — POCT GLYCOSYLATED HEMOGLOBIN (HGB A1C): HbA1c, POC (controlled diabetic range): 6.2 % (ref 0.0–7.0)

## 2023-10-01 MED ORDER — ATORVASTATIN CALCIUM 10 MG PO TABS: 10.0000 mg | ORAL_TABLET | Freq: Every day | ORAL | 4 refills | Status: AC

## 2023-10-01 MED ORDER — LISINOPRIL 5 MG PO TABS
5.0000 mg | ORAL_TABLET | Freq: Every day | ORAL | 4 refills | Status: AC
Start: 2023-10-01 — End: ?

## 2023-10-01 MED ORDER — METFORMIN HCL ER 500 MG PO TB24: 1000.0000 mg | ORAL_TABLET | Freq: Every day | ORAL | 4 refills | Status: AC

## 2023-10-01 MED ORDER — TERBINAFINE HCL 250 MG PO TABS: 250.0000 mg | ORAL_TABLET | Freq: Every day | ORAL | 0 refills | Status: DC

## 2023-10-01 NOTE — Progress Notes (Signed)
 Patient ID: Allison Monroe, female    DOB: [birth year]  MRN: [id number]  CC: chronic ds management  Subjective: Allison Monroe is a 38 y.o. female who presents for chronic ds management. Her concerns today include:  Patient with history of DM type II, HL, GERD, right adrenal adenoma stable on MRI 02/2023. No further follow-up needed.   AMN Language interpreter used during this encounter. [id number]   Discussed the use of AI scribe software for clinical note transcription with the patient, who gave verbal consent to proceed.  History of Present Illness Allison Monroe is a 38 year old female with diabetes and hyperlipidemia who presents for follow-up.  DM:  Results for orders placed or performed in visit on 10/01/23  HgB A1c   Collection Time: 10/01/23  9:27 AM  Result Value Ref Range   Hemoglobin A1C     HbA1c POC (<> result, manual entry)     HbA1c, POC (prediabetic range)     HbA1c, POC (controlled diabetic range) 6.2 0.0 - 7.0 %  Her diabetes is well-controlled with an A1c of 6.2%. She takes metformin  XR1000 mg once daily. She occasionally checks her blood sugar levels and maintains a healthy diet, over indulging only once or twice a month. She walks for 30 minutes daily, and her weight has remained stable since last visit 4 mths ago  She is taking lisinopril  5 mg daily for proteinuria. She is not planning to get pregnant at this time.  Hyperlipidemia: we reduced atorvastatin  dose from 20 mg to 10 mg daily on last visit due to cramps, which have since resolved. It has been a year since her last cholesterol check.  She has developed blisters on her right foot, which occur almost every summer. She has been using a cream containing clotrimazole  and betamethasone , which dries the blisters slowly. The blisters sometimes appear on her left foot as well, but not this year. The current blister appeared three weeks ago and is just starting to dry. Itches. She  wonders if there is something stronger to use.  Three days ago, she fell and injured her right hand, landing on it with her full weight. She has been applying ice for two days, and there is no current swelling. She experiences pain in a specific area of her hand, especially with movement. Currently wearing a wrist splint.    Patient Active Problem List   Diagnosis Date Noted   Right flank pain 02/01/2023   Other microscopic hematuria 02/01/2023   Contact dermatitis 05/26/2021   Microalbuminuria 04/26/2021   Gastroesophageal reflux disease without esophagitis 04/25/2021   Androgenetic alopecia 05/31/2020   Glaucoma of both eyes 03/08/2020   New onset type 2 diabetes mellitus (HCC) 03/08/2020   Umbilical hernia 03/08/2020   Benign Adenoma of right adrenal gland 03/08/2020   Hyperlipidemia due to type 2 diabetes mellitus (HCC) 02/22/2020     Current Outpatient Medications on File Prior to Visit  Medication Sig Dispense Refill   atorvastatin  (LIPITOR) 10 MG tablet Take 1 tablet (10 mg total) by mouth daily. 90 tablet 3   Blood Glucose Monitoring Suppl (TRUE METRIX METER) w/Device KIT Use as directed to check blood sugars 1 kit 0   glucose blood (TRUE METRIX BLOOD GLUCOSE TEST) test strip Use as instructed to initially check blood sugars twice per day- fasting in the morning and before bedtime 100 each 2   ibuprofen  (ADVIL ) 600 MG tablet Take 1 tablet (600 mg total) by mouth every 8 (eight) hours  as needed. Take with food 20 tablet 0   lisinopril  (ZESTRIL ) 5 MG tablet Take 1 tablet (5 mg total) by mouth daily. 90 tablet 2   medroxyPROGESTERone (DEPO-PROVERA) 150 MG/ML injection Inject 150 mg into the muscle every 3 (three) months.      metFORMIN  (GLUCOPHAGE -XR) 500 MG 24 hr tablet TAKE 2 TABLETS BY MOUTH ONCE DAILY WITH BREAKFAST 180 tablet 0   pantoprazole  (PROTONIX ) 40 MG tablet TAKE 1 TABLET BY MOUTH ONCE DAILY BEFORE BREAKFAST 90 tablet 0   timolol  (TIMOPTIC ) 0.5 % ophthalmic solution 1  drop 2 (two) times daily.     TRUEplus Lancets 28G MISC Use to check blood sugar twice a day 100 each 3   [DISCONTINUED] omeprazole  (PRILOSEC) 20 MG capsule Take 1 capsule (20 mg total) by mouth daily. (Patient not taking: No sig reported) 15 capsule 1   No current facility-administered medications on file prior to visit.    Allergies  Allergen Reactions   Prevnar 20 [Pneumococcal 20-Val Conj Vacc]     Redness, swelling, and itching at the injection site.  Fever.  Redness of the eyes    Social History   Socioeconomic History   Marital status: Single    Spouse name: Not on file   Number of children: Not on file   Years of education: Not on file   Highest education level: Not on file  Occupational History   Not on file  Tobacco Use   Smoking status: Never   Smokeless tobacco: Never  Vaping Use   Vaping status: Never Used  Substance and Sexual Activity   Alcohol use: Not Currently   Drug use: Never   Sexual activity: Yes  Other Topics Concern   Not on file  Social History Narrative   Living with husband and daughters   Right handed   Social Drivers of Health   Financial Resource Strain: Medium Risk (02/01/2023)   Overall Financial Resource Strain (CARDIA)    Difficulty of Paying Living Expenses: Somewhat hard  Food Insecurity: No Food Insecurity (02/01/2023)   Hunger Vital Sign    Worried About Running Out of Food in the Last Year: Never true    Ran Out of Food in the Last Year: Never true  Transportation Needs: No Transportation Needs (02/01/2023)   PRAPARE - Administrator, Civil Service (Medical): No    Lack of Transportation (Non-Medical): No  Physical Activity: Insufficiently Active (02/01/2023)   Exercise Vital Sign    Days of Exercise per Week: 1 day    Minutes of Exercise per Session: 40 min  Stress: Stress Concern Present (02/01/2023)   [employer] of Occupational Health - Occupational Stress Questionnaire    Feeling of Stress : Rather  much  Social Connections: Not on file  Intimate Partner Violence: Not on file    Family History  Problem Relation Age of Onset   Hypertension Mother    Diabetes Father     Past Surgical History:  Procedure Laterality Date   NO PAST SURGERIES      ROS: Review of Systems Negative except as stated above  PHYSICAL EXAM: BP 110/74 (BP Location: Right Arm, Patient Position: Sitting, Cuff Size: Large)   Pulse (!) 59   Temp 98.2 F (36.8 C) (Oral)   Resp 14   Ht 5' 3 (1.6 m)   Wt 181 lb 12.8 oz (82.5 kg)   SpO2 100%   BMI 32.20 kg/m   Wt Readings from Last 3 Encounters:  10/01/23  181 lb 12.8 oz (82.5 kg)  06/05/23 180 lb (81.6 kg)  06/01/23 179 lb (81.2 kg)    Physical Exam  General appearance - alert, well appearing, obese middle age Hispanic female and in no distress Mental status - normal mood, behavior, speech, dress, motor activity, and thought processes Chest - clear to auscultation, no wheezes, rales or rhonchi, symmetric air entry Heart - normal rate, regular rhythm, normal S1, S2, no murmurs, rubs, clicks or gallops Extremities - peripheral pulses normal, no pedal edema, no clubbing or cyanosis RT hand: Splint was removed.  She has mild edema over the radial aspect of the wrist.  She has mild to moderate discomfort with attempted passive range of motion of the wrist.  Good range of motion of the thumb and other fingers. Skin: She has mild peeling with what appears to be a healing small blister on the lateral dorsal surface of the right foot.      Latest Ref Rng & Units 06/01/2023    9:40 AM 02/01/2023    9:02 AM 07/26/2022   11:52 AM  CMP  Glucose 70 - 99 mg/dL 896  874  881   BUN 6 - 20 mg/dL 17  15  13    Creatinine 0.57 - 1.00 mg/dL 9.14  9.14  9.11   Sodium 134 - 144 mmol/L 140  140  139   Potassium 3.5 - 5.2 mmol/L 4.7  4.5  4.4   Chloride 96 - 106 mmol/L 105  104  104   CO2 20 - 29 mmol/L 19  20  20    Calcium  8.7 - 10.2 mg/dL 89.9  9.7  89.9   Total  Protein 6.0 - 8.5 g/dL  7.4    Total Bilirubin 0.0 - 1.2 mg/dL  0.4    Alkaline Phos 44 - 121 IU/L  124    AST 0 - 40 IU/L  20    ALT 0 - 32 IU/L  23     Lipid Panel     Component Value Date/Time   CHOL 152 07/26/2022 1152   TRIG 120 07/26/2022 1152   HDL 42 07/26/2022 1152   CHOLHDL 3.6 07/26/2022 1152   LDLCALC 88 07/26/2022 1152    CBC    Component Value Date/Time   WBC 7.8 02/01/2023 0902   WBC 10.5 01/20/2020 1612   RBC 4.73 02/01/2023 0902   RBC 4.49 01/20/2020 1612   HGB 13.9 02/01/2023 0902   HCT 42.1 02/01/2023 0902   PLT 366 02/01/2023 0902   MCV 89 02/01/2023 0902   MCH 29.4 02/01/2023 0902   MCH 28.7 01/20/2020 1612   MCHC 33.0 02/01/2023 0902   MCHC 32.5 01/20/2020 1612   RDW 12.6 02/01/2023 0902   LYMPHSABS 3.4 (H) 02/01/2023 0902   EOSABS 0.1 02/01/2023 0902   BASOSABS 0.0 02/01/2023 0902    ASSESSMENT AND PLAN: 1. Type 2 diabetes mellitus with obesity (HCC) (Primary) At goal.  Continue metformin  1000 mg daily.  Continue healthy eating habits and regular exercise. - HgB A1c - Lipid panel - metFORMIN  (GLUCOPHAGE -XR) 500 MG 24 hr tablet; Take 2 tablets (1,000 mg total) by mouth daily with breakfast.  Dispense: 180 tablet; Refill: 4  2. Diabetes mellitus treated with oral medication (HCC) See #1 above.  3. Hyperlipidemia associated with type 2 diabetes mellitus (HCC) Patient tolerating the lower dose of atorvastatin . - atorvastatin  (LIPITOR) 10 MG tablet; Take 1 tablet (10 mg total) by mouth daily.  Dispense: 90 tablet; Refill: 4 - Hepatic  Function Panel  4. Microalbuminuria Continue lisinopril .  Advised that if and when she plans to get pregnant, she should let us  know so that the medicine can be stopped as it is contraindicated in pregnancy. - lisinopril  (ZESTRIL ) 5 MG tablet; Take 1 tablet (5 mg total) by mouth daily.  Dispense: 90 tablet; Refill: 4  5. Acute pain of right wrist Sprain versus fracture of the right wrist.  Will send her over to  the hospital to get an x-ray.  If x-ray is negative for fracture, patient advised to continue to ice it and use ibuprofen  or Advil  over-the-counter as needed for the next several days. - DG Wrist Complete Right; Future  7. Tinea pedis, right Not completely resolved with topical treatment.  Will give several days of oral Lamisil . - terbinafine  (LAMISIL ) 250 MG tablet; Take 1 tablet (250 mg total) by mouth daily.  Dispense: 5 tablet; Refill: 0       Patient was given the opportunity to ask questions.  Patient verbalized understanding of the plan and was able to repeat key elements of the plan.   This documentation was completed using Paediatric nurse.  Any transcriptional errors are unintentional.  Orders Placed This Encounter  Procedures   HgB A1c     Requested Prescriptions    No prescriptions requested or ordered in this encounter    No follow-ups on file.  Barnie Louder, MD, FACP

## 2023-10-02 ENCOUNTER — Ambulatory Visit: Payer: Self-pay | Admitting: Internal Medicine

## 2023-10-02 LAB — LIPID PANEL
Chol/HDL Ratio: 3.5 ratio (ref 0.0–4.4)
Cholesterol, Total: 165 mg/dL (ref 100–199)
HDL: 47 mg/dL (ref >39)
LDL Chol Calc (NIH): 101 mg/dL — ABNORMAL HIGH (ref 0–99)
Triglycerides: 93 mg/dL (ref 0–149)
VLDL Cholesterol Cal: 17 mg/dL (ref 5–40)

## 2023-10-02 LAB — HEPATIC FUNCTION PANEL
ALT: 23 IU/L (ref 0–32)
AST: 20 IU/L (ref 0–40)
Albumin: 4.4 g/dL (ref 3.9–4.9)
Alkaline Phosphatase: 107 IU/L (ref 44–121)
Bilirubin Total: 0.6 mg/dL (ref 0.0–1.2)
Bilirubin, Direct: 0.22 mg/dL (ref 0.00–0.40)
Total Protein: 7.6 g/dL (ref 6.0–8.5)

## 2023-10-22 ENCOUNTER — Other Ambulatory Visit: Payer: Self-pay | Admitting: Critical Care Medicine

## 2023-10-22 DIAGNOSIS — E1169 Type 2 diabetes mellitus with other specified complication: Secondary | ICD-10-CM

## 2024-01-31 ENCOUNTER — Telehealth: Payer: Self-pay | Admitting: Internal Medicine

## 2024-01-31 NOTE — Telephone Encounter (Signed)
Confirmed appt for 11/14

## 2024-02-01 ENCOUNTER — Other Ambulatory Visit: Payer: Self-pay | Admitting: Internal Medicine

## 2024-02-01 ENCOUNTER — Ambulatory Visit: Payer: Self-pay | Attending: Internal Medicine | Admitting: Internal Medicine

## 2024-02-01 ENCOUNTER — Encounter: Payer: Self-pay | Admitting: Internal Medicine

## 2024-02-01 VITALS — BP 105/69 | HR 58 | Temp 98.2°F | Ht 63.0 in | Wt 185.0 lb

## 2024-02-01 DIAGNOSIS — N644 Mastodynia: Secondary | ICD-10-CM

## 2024-02-01 DIAGNOSIS — E669 Obesity, unspecified: Secondary | ICD-10-CM

## 2024-02-01 DIAGNOSIS — E785 Hyperlipidemia, unspecified: Secondary | ICD-10-CM

## 2024-02-01 DIAGNOSIS — E119 Type 2 diabetes mellitus without complications: Secondary | ICD-10-CM

## 2024-02-01 DIAGNOSIS — Z7984 Long term (current) use of oral hypoglycemic drugs: Secondary | ICD-10-CM

## 2024-02-01 DIAGNOSIS — E1169 Type 2 diabetes mellitus with other specified complication: Secondary | ICD-10-CM

## 2024-02-01 DIAGNOSIS — Z6832 Body mass index (BMI) 32.0-32.9, adult: Secondary | ICD-10-CM

## 2024-02-01 LAB — POCT GLYCOSYLATED HEMOGLOBIN (HGB A1C): HbA1c, POC (controlled diabetic range): 6.1 % (ref 0.0–7.0)

## 2024-02-01 LAB — GLUCOSE, POCT (MANUAL RESULT ENTRY): POC Glucose: 105 mg/dL — AB (ref 70–99)

## 2024-02-01 NOTE — Progress Notes (Signed)
 Patient ID: Allison Monroe, female    DOB: [birth year]  MRN: [id number]  CC: Diabetes (DM f/u. /Intermittent painful breasts - requesting mammogram /Already received flu vax)   Subjective: Allison Monroe is a 38 y.o. female who presents for chronic ds management. Her concerns today include:  Patient with history of DM type II, on Lisinopril  for microalbumin, HL, GERD, right adrenal adenoma stable on MRI 02/2023. No further follow-up needed.   AMN Language interpreter used during this encounter. [id number] Maibe   Discussed the use of AI scribe software for clinical note transcription with the patient, who gave verbal consent to proceed.  History of Present Illness Allison Monroe is a 38 year old female with diabetes and hyperlipidemia who presents for follow-up.  DM: Results for orders placed or performed in visit on 02/01/24  POCT glucose (manual entry)   Collection Time: 02/01/24  9:39 AM  Result Value Ref Range   POC Glucose 105 (A) 70 - 99 mg/dl  POCT glycosylated hemoglobin (Hb A1C)   Collection Time: 02/01/24  9:48 AM  Result Value Ref Range   Hemoglobin A1C     HbA1c POC (<> result, manual entry)     HbA1c, POC (prediabetic range)     HbA1c, POC (controlled diabetic range) 6.1 0.0 - 7.0 %  Her most recent A1c is 6.1, slightly improved from 6.2 four months ago. She continues to take metformin  1000 mg daily. She tries to maintain healthy eating habits but occasionally lapses, especially with the upcoming holiday season. She exercises by walking two to three times a week for 20 to 40 minutes.  She manages her hyperlipidemia with atorvastatin  10 mg daily. Additionally, she is on lisinopril  for proteinuria, and a urine test for protein is due today as part of her annual check.  She has experienced painful breasts for the past three months, initially starting with the left breast and then affecting both. The pain is constant and sometimes severe enough to prevent  her from wearing a bra or using a car seatbelt. Seen at [facility] for same several wks ago. She was told it may be hormonal and was advised to take vitamin E, which initially seemed to help, but the pain returned despite continued use. Wears comfortable fitting bras. She has been on the Depo Provera shot for 15 years and does not experience menstrual cycles. No family history of breast cancer.    Patient Active Problem List   Diagnosis Date Noted   Right flank pain 02/01/2023   Other microscopic hematuria 02/01/2023   Contact dermatitis 05/26/2021   Microalbuminuria 04/26/2021   Gastroesophageal reflux disease without esophagitis 04/25/2021   Androgenetic alopecia 05/31/2020   Glaucoma of both eyes 03/08/2020   New onset type 2 diabetes mellitus (HCC) 03/08/2020   Umbilical hernia 03/08/2020   Benign Adenoma of right adrenal gland 03/08/2020   Hyperlipidemia due to type 2 diabetes mellitus (HCC) 02/22/2020     Current Outpatient Medications on File Prior to Visit  Medication Sig Dispense Refill   atorvastatin  (LIPITOR) 10 MG tablet Take 1 tablet (10 mg total) by mouth daily. 90 tablet 4   Blood Glucose Monitoring Suppl (TRUE METRIX METER) w/Device KIT Use as directed to check blood sugars 1 kit 0   glucose blood (TRUE METRIX BLOOD GLUCOSE TEST) test strip Use as instructed to initially check blood sugars twice per day- fasting in the morning and before bedtime 100 each 2   ibuprofen  (ADVIL ) 600 MG tablet Take 1 tablet (600  mg total) by mouth every 8 (eight) hours as needed. Take with food 20 tablet 0   lisinopril  (ZESTRIL ) 5 MG tablet Take 1 tablet (5 mg total) by mouth daily. 90 tablet 4   medroxyPROGESTERone (DEPO-PROVERA) 150 MG/ML injection Inject 150 mg into the muscle every 3 (three) months.      metFORMIN  (GLUCOPHAGE -XR) 500 MG 24 hr tablet Take 2 tablets (1,000 mg total) by mouth daily with breakfast. 180 tablet 4   pantoprazole  (PROTONIX ) 40 MG tablet TAKE 1 TABLET BY MOUTH ONCE DAILY  BEFORE BREAKFAST 90 tablet 0   terbinafine  (LAMISIL ) 250 MG tablet Take 1 tablet (250 mg total) by mouth daily. 5 tablet 0   timolol  (TIMOPTIC ) 0.5 % ophthalmic solution 1 drop 2 (two) times daily.     TRUEplus Lancets 28G MISC Use to check blood sugar twice a day 100 each 3   [DISCONTINUED] omeprazole  (PRILOSEC) 20 MG capsule Take 1 capsule (20 mg total) by mouth daily. (Patient not taking: No sig reported) 15 capsule 1   No current facility-administered medications on file prior to visit.    Allergies  Allergen Reactions   Prevnar 20 [Pneumococcal 20-Val Conj Vacc]     Redness, swelling, and itching at the injection site.  Fever.  Redness of the eyes    Social History   Socioeconomic History   Marital status: Single    Spouse name: Not on file   Number of children: Not on file   Years of education: Not on file   Highest education level: Not on file  Occupational History   Not on file  Tobacco Use   Smoking status: Never   Smokeless tobacco: Never  Vaping Use   Vaping status: Never Used  Substance and Sexual Activity   Alcohol use: Not Currently   Drug use: Never   Sexual activity: Yes  Other Topics Concern   Not on file  Social History Narrative   Living with husband and daughters   Right handed   Social Drivers of Health   Financial Resource Strain: Medium Risk (02/01/2023)   Overall Financial Resource Strain (CARDIA)    Difficulty of Paying Living Expenses: Somewhat hard  Food Insecurity: No Food Insecurity (02/01/2023)   Hunger Vital Sign    Worried About Running Out of Food in the Last Year: Never true    Ran Out of Food in the Last Year: Never true  Transportation Needs: No Transportation Needs (02/01/2023)   PRAPARE - Administrator, Civil Service (Medical): No    Lack of Transportation (Non-Medical): No  Physical Activity: Insufficiently Active (02/01/2023)   Exercise Vital Sign    Days of Exercise per Week: 1 day    Minutes of Exercise per  Session: 40 min  Stress: Stress Concern Present (02/01/2023)   [employer] of Occupational Health - Occupational Stress Questionnaire    Feeling of Stress : Rather much  Social Connections: Not on file  Intimate Partner Violence: Not on file    Family History  Problem Relation Age of Onset   Hypertension Mother    Diabetes Father     Past Surgical History:  Procedure Laterality Date   NO PAST SURGERIES      ROS: Review of Systems Negative except as stated above  PHYSICAL EXAM: BP 105/69 (BP Location: Left Arm, Patient Position: Sitting, Cuff Size: Normal)   Pulse (!) 58   Temp 98.2 F (36.8 C) (Oral)   Ht 5' 3 (1.6 m)   Wt  185 lb (83.9 kg)   SpO2 100%   BMI 32.77 kg/m   Wt Readings from Last 3 Encounters:  02/01/24 185 lb (83.9 kg)  10/01/23 181 lb 12.8 oz (82.5 kg)  06/05/23 180 lb (81.6 kg)    Physical Exam  General appearance - alert, well appearing, and in no distress Mental status - normal mood, behavior, speech, dress, motor activity, and thought processes Neck - supple, no significant adenopathy Chest - clear to auscultation, no wheezes, rales or rhonchi, symmetric air entry Heart - normal rate, regular rhythm, normal S1, S2, no murmurs, rubs, clicks or gallops Breasts - CMA Clarisa present: breasts appear normal, no suspicious masses, no skin or nipple changes or axillary nodes. Very sensitive to touch Extremities - peripheral pulses normal, no pedal edema, no clubbing or cyanosis      Latest Ref Rng & Units 10/01/2023   10:29 AM 06/01/2023    9:40 AM 02/01/2023    9:02 AM  CMP  Glucose 70 - 99 mg/dL  896  874   BUN 6 - 20 mg/dL  17  15   Creatinine 9.42 - 1.00 mg/dL  9.14  9.14   Sodium 865 - 144 mmol/L  140  140   Potassium 3.5 - 5.2 mmol/L  4.7  4.5   Chloride 96 - 106 mmol/L  105  104   CO2 20 - 29 mmol/L  19  20   Calcium  8.7 - 10.2 mg/dL  89.9  9.7   Total Protein 6.0 - 8.5 g/dL 7.6   7.4   Total Bilirubin 0.0 - 1.2 mg/dL 0.6   0.4    Alkaline Phos 44 - 121 IU/L 107   124   AST 0 - 40 IU/L 20   20   ALT 0 - 32 IU/L 23   23    Lipid Panel     Component Value Date/Time   CHOL 165 10/01/2023 1029   TRIG 93 10/01/2023 1029   HDL 47 10/01/2023 1029   CHOLHDL 3.5 10/01/2023 1029   LDLCALC 101 (H) 10/01/2023 1029    CBC    Component Value Date/Time   WBC 7.8 02/01/2023 0902   WBC 10.5 01/20/2020 1612   RBC 4.73 02/01/2023 0902   RBC 4.49 01/20/2020 1612   HGB 13.9 02/01/2023 0902   HCT 42.1 02/01/2023 0902   PLT 366 02/01/2023 0902   MCV 89 02/01/2023 0902   MCH 29.4 02/01/2023 0902   MCH 28.7 01/20/2020 1612   MCHC 33.0 02/01/2023 0902   MCHC 32.5 01/20/2020 1612   RDW 12.6 02/01/2023 0902   LYMPHSABS 3.4 (H) 02/01/2023 0902   EOSABS 0.1 02/01/2023 0902   BASOSABS 0.0 02/01/2023 0902    ASSESSMENT AND PLAN: 1. Type 2 diabetes mellitus in patient with obesity (HCC) (Primary) Controlled. Continue Metformin  and regular exercise - Advised on maintaining healthy dietary habits, especially during the upcoming holidays. - Microalbumin / creatinine urine ratio  2. Diabetes mellitus treated with oral medication (HCC) See #1 above - POCT glucose (manual entry) - POCT glycosylated hemoglobin (Hb A1C)  3. Hyperlipidemia associated with type 2 diabetes mellitus (HCC) - Continue atorvastatin  10 mg daily. Does not tolerate higher doses  4. Mastalgia Use Ibuprofen  OTC PRN Will get MMG with [employer] scholarship - MM 3D DIAGNOSTIC MAMMOGRAM BILATERAL BREAST; Future    Patient was given the opportunity to ask questions.  Patient verbalized understanding of the plan and was able to repeat key elements of the plan.  This documentation was completed using Paediatric nurse.  Any transcriptional errors are unintentional.  Orders Placed This Encounter  Procedures   MM 3D DIAGNOSTIC MAMMOGRAM BILATERAL BREAST   Microalbumin / creatinine urine ratio   POCT glucose (manual entry)   POCT  glycosylated hemoglobin (Hb A1C)     Requested Prescriptions    No prescriptions requested or ordered in this encounter    Return in about 4 months (around 05/31/2024).  Barnie Louder, MD, FACP

## 2024-02-01 NOTE — Patient Instructions (Signed)
  VISIT SUMMARY: Today, you came in for a follow-up visit to manage your diabetes, hyperlipidemia, and proteinuria, and to address your recent breast pain. Your diabetes is well-controlled with a slight improvement in your A1c levels. We discussed your current medications and lifestyle habits, and you were given advice on maintaining your health. Additionally, we addressed your breast pain and planned further evaluation.  YOUR PLAN: -TYPE 2 DIABETES MELLITUS: Type 2 diabetes is a condition where your body does not use insulin properly, leading to high blood sugar levels. Your A1c level is now 6.1, which shows good control. Continue taking metformin  1000 mg daily, maintain healthy eating habits, especially avoiding sugary drinks and sweets, and keep up with regular exercise like walking 2-3 times a week.  -HYPERLIPIDEMIA: Hyperlipidemia is a condition with high levels of fats (lipids) in your blood, which can increase the risk of heart disease. You are managing this with atorvastatin  10 mg daily. Continue taking your medication as prescribed.  -PROTEINURIA: Proteinuria is the presence of excess protein in your urine, which can be a sign of kidney disease. You are managing this with lisinopril . Continue taking lisinopril  as prescribed. A urine protein test is due today as part of your annual check-up.  -BILATERAL MASTODYNIA (BREAST PAIN): Bilateral mastodynia is pain in both breasts. You have been experiencing this pain for three months, and it can be severe. We have ordered a mammogram to further evaluate the cause. In the meantime, you can use ibuprofen  as needed for pain relief.  INSTRUCTIONS: Please follow up with the urine protein test today. Continue with your current medications and lifestyle habits as discussed. We will contact you with the results of your mammogram once it is completed.                      Contains text generated by [employer].                                  Contains text generated by [employer].

## 2024-02-03 ENCOUNTER — Ambulatory Visit: Payer: Self-pay | Admitting: Internal Medicine

## 2024-02-03 LAB — MICROALBUMIN / CREATININE URINE RATIO
Creatinine, Urine: 160.4 mg/dL
Microalb/Creat Ratio: 74 mg/g{creat} — ABNORMAL HIGH (ref 0–29)
Microalbumin, Urine: 118.7 ug/mL

## 2024-02-05 ENCOUNTER — Other Ambulatory Visit: Payer: Self-pay

## 2024-02-05 DIAGNOSIS — N644 Mastodynia: Secondary | ICD-10-CM

## 2024-03-06 ENCOUNTER — Ambulatory Visit: Payer: Self-pay

## 2024-03-06 ENCOUNTER — Inpatient Hospital Stay
Admission: RE | Admit: 2024-03-06 | Discharge: 2024-03-06 | Payer: Self-pay | Attending: Obstetrics and Gynecology | Admitting: Obstetrics and Gynecology

## 2024-03-06 ENCOUNTER — Other Ambulatory Visit: Payer: Self-pay

## 2024-03-06 VITALS — BP 110/62 | Wt 182.0 lb

## 2024-03-06 DIAGNOSIS — N644 Mastodynia: Secondary | ICD-10-CM

## 2024-03-06 DIAGNOSIS — Z1239 Encounter for other screening for malignant neoplasm of breast: Secondary | ICD-10-CM

## 2024-03-06 NOTE — Progress Notes (Signed)
 [name] is a 38 y.o. female who presents to [facility] clinic today with complaint of bilateral diffuse breast pain x 1 month that comes and goes. Patient rates the pain at a 8 out of 10.    Pap Smear: Pap smear not completed today. Last Pap smear was 08/31/2022 at [facility] and was normal with negative HPV. Per patient has history of an abnormal Pap smear 8-10 years ago that a colposcopy was completed for follow up. Patient stated all Pap smears have been normal since colposcopy and that she has had at least three normal Pap smears. Last Pap smear result is available in Epic.   Physical exam: Breasts Breasts symmetrical. No skin abnormalities bilateral breasts. No nipple retraction right breast. Left nipple inverted that per patient is normal for her. No nipple discharge bilateral breasts. No lymphadenopathy. No lumps palpated bilateral breasts. Complaints of bilateral nipple pain on exam.      Pelvic/Bimanual Pap is not indicated today per BCCCP guidelines.   Smoking History: Patient has never smoked.   Patient Navigation: Patient education provided. Access to services provided for patient through BCCCP program.    Breast and Cervical Cancer Risk Assessment: Patient does not have family history of breast cancer, known genetic mutations, or radiation treatment to the chest before age 42. Per patient has history of cervical dysplasia. Patient has no history of being immunocompromised or DES exposure in-utero.  Risk Scores as of Encounter on 03/06/2024     Allison Monroe           5-year 0.36%   Lifetime 7.37%   This patient is [place] but has no documented birth country, so the [place] model used data from [place] patients to calculate their risk score. Document a birth country in the Demographics activity for a more accurate score.         Last calculated by Silas, Ansyi K, CMA on 03/06/2024 at  8:50 AM        A: BCCCP exam without pap  smear Complaint of bilateral breast pain.  P: Referred patient to the [facility] for a diagnostic mammogram. Appointment scheduled Thursday, March 06, 2024 at 1420.  Driscilla Wanda SQUIBB, RN 03/06/2024 8:51 AM

## 2024-03-06 NOTE — Patient Instructions (Addendum)
 Explained breast self awareness with Brilynn Perulero-Tornez. Patient did not need a Pap smear today due to last Pap smear and HPV typing was 08/31/2022. Let her know that her next Pap smear is due in 3 years per her PCP. Referred patient to the [facility] for a diagnostic mammogram. Appointment scheduled Thursday, March 06, 2024 at 1420. Patient aware of appointment and will be there. Krupa Perulero-Tornez verbalized understanding.  Bradley Handyside, Wanda Ship, RN 8:51 AM

## 2024-03-07 ENCOUNTER — Ambulatory Visit: Payer: Self-pay | Admitting: Obstetrics & Gynecology

## 2024-06-02 ENCOUNTER — Ambulatory Visit: Payer: Self-pay | Admitting: Internal Medicine
